# Patient Record
Sex: Female | Born: 1970 | Race: White | Hispanic: No | Marital: Married | Smoking: Current every day smoker
Health system: Southern US, Community
[De-identification: ages and names within clinical notes are randomized; demographics above are authoritative.]

## PROBLEM LIST (undated history)

## (undated) DIAGNOSIS — K5900 Constipation, unspecified: Secondary | ICD-10-CM

## (undated) DIAGNOSIS — I10 Essential (primary) hypertension: Secondary | ICD-10-CM

## (undated) DIAGNOSIS — Z86718 Personal history of other venous thrombosis and embolism: Secondary | ICD-10-CM

## (undated) DIAGNOSIS — Z973 Presence of spectacles and contact lenses: Secondary | ICD-10-CM

## (undated) DIAGNOSIS — D259 Leiomyoma of uterus, unspecified: Secondary | ICD-10-CM

## (undated) DIAGNOSIS — Z972 Presence of dental prosthetic device (complete) (partial): Secondary | ICD-10-CM

## (undated) HISTORY — DX: Leiomyoma of uterus, unspecified: D25.9

## (undated) HISTORY — PX: TONSILLECTOMY: SUR1361

## (undated) HISTORY — DX: Essential (primary) hypertension: I10

## (undated) HISTORY — PX: DILATION AND CURETTAGE OF UTERUS: SHX78

---

## 1996-02-26 DIAGNOSIS — Z86718 Personal history of other venous thrombosis and embolism: Secondary | ICD-10-CM

## 1996-02-26 HISTORY — DX: Personal history of other venous thrombosis and embolism: Z86.718

## 1997-06-07 ENCOUNTER — Other Ambulatory Visit: Admission: RE | Admit: 1997-06-07 | Discharge: 1997-06-07 | Payer: Self-pay | Admitting: Obstetrics and Gynecology

## 1999-02-02 ENCOUNTER — Ambulatory Visit (HOSPITAL_COMMUNITY): Admission: RE | Admit: 1999-02-02 | Discharge: 1999-02-02 | Payer: Self-pay | Admitting: Obstetrics & Gynecology

## 1999-02-02 ENCOUNTER — Encounter: Payer: Self-pay | Admitting: Obstetrics & Gynecology

## 1999-02-06 ENCOUNTER — Inpatient Hospital Stay (HOSPITAL_COMMUNITY): Admission: AD | Admit: 1999-02-06 | Discharge: 1999-02-08 | Payer: Self-pay | Admitting: Obstetrics & Gynecology

## 1999-02-06 ENCOUNTER — Encounter (INDEPENDENT_AMBULATORY_CARE_PROVIDER_SITE_OTHER): Payer: Self-pay | Admitting: Specialist

## 1999-02-10 ENCOUNTER — Inpatient Hospital Stay (HOSPITAL_COMMUNITY): Admission: AD | Admit: 1999-02-10 | Discharge: 1999-02-10 | Payer: Self-pay | Admitting: Obstetrics and Gynecology

## 1999-02-10 ENCOUNTER — Encounter: Payer: Self-pay | Admitting: Obstetrics and Gynecology

## 1999-02-10 ENCOUNTER — Encounter: Admission: RE | Admit: 1999-02-10 | Discharge: 1999-05-11 | Payer: Self-pay | Admitting: Obstetrics and Gynecology

## 1999-07-02 ENCOUNTER — Encounter (INDEPENDENT_AMBULATORY_CARE_PROVIDER_SITE_OTHER): Payer: Self-pay | Admitting: *Deleted

## 1999-07-02 ENCOUNTER — Ambulatory Visit (HOSPITAL_BASED_OUTPATIENT_CLINIC_OR_DEPARTMENT_OTHER): Admission: RE | Admit: 1999-07-02 | Discharge: 1999-07-03 | Payer: Self-pay | Admitting: Otolaryngology

## 2005-01-09 ENCOUNTER — Other Ambulatory Visit: Admission: RE | Admit: 2005-01-09 | Discharge: 2005-01-09 | Payer: Self-pay | Admitting: Obstetrics & Gynecology

## 2005-05-02 ENCOUNTER — Encounter (INDEPENDENT_AMBULATORY_CARE_PROVIDER_SITE_OTHER): Payer: Self-pay | Admitting: *Deleted

## 2005-05-02 ENCOUNTER — Ambulatory Visit (HOSPITAL_COMMUNITY): Admission: RE | Admit: 2005-05-02 | Discharge: 2005-05-02 | Payer: Self-pay | Admitting: Obstetrics & Gynecology

## 2010-02-02 ENCOUNTER — Encounter
Admission: RE | Admit: 2010-02-02 | Discharge: 2010-02-02 | Payer: Self-pay | Source: Home / Self Care | Attending: Obstetrics & Gynecology | Admitting: Obstetrics & Gynecology

## 2010-07-13 NOTE — Op Note (Signed)
[facility]  Patient:    [name]                    MRN: [id number] Proc. Date: 02/06/99 Adm. Date:  [id number] Attending:  Mickle Mallory                           Operative Report  AGE:                          40  PREOPERATIVE DIAGNOSIS:       Suspected macrosomia, post dates and unfavorable                               cervix, desires permanent elective sterilization.  POSTOPERATIVE DIAGNOSIS:      Suspected macrosomia, past dates and unfavorable                               cervix, desires permanent elective sterilization lus                               delivery of 9 pound and 10 ounce female infant with                               Apgars of 9 and 9, and segment of each fallopian ube                               - pathology pending on them.  OPERATION:                    Primary low transverse cesarean section and [place]                               tubal sterilization for elective sterilization.  SURGEON:                      Gerrit Friends. Aldona Bar, M.D.  ASSISTANT:                    Luvenia Redden, M.D.  ANESTHESIA:                   Spinal - Donald T. Pamalee Leyden, M.D.  INDICATIONS:                  This is a 40 year old primigravida at 13-[redacted] weeks  gestation who was seen in the office on December 11 by Marcelle Overlie, M.D. At that time, a nonstress test was reactive and an ultrasound for estimated fetal weight done three days earlier revealed an estimated fetal weight of at least nine pounds.  The patient also expresses a desire for permanent elective sterilization procedure, understanding that such a procedure is meant to be 100% permanent, but unfortunately, it is not 100% perfect - subsequent pregnancy can result.                                After a detailed discussion with  the patient as to options, the decision was made to proceed with primary low transverse cesarean section with primary diagnosis of  suspected macrosomia and carry out a tubal sterilization procedure at the time of the cesarean section.  She is taken to the operating room according to her wishes at this time for such a procedure.  DESCRIPTION OF PROCEDURE:     The patient was taken to the operating room after  having received 2 g of ampicillin intravenously in the preoperative area (positive group B Strep culture _______).  Once in the operating room, a spinal anesthetic was given by Dr. Pamalee Leyden without difficulty, and thereafter, the patient was prepped and draped in the usual fashion, having been placed in the supine position with  slight tilt to the left with a Foley catheter observed as part of the prep.                                Once good anesthesia levels were documented, the procedure was begun.  A Pfannenstiel incision was made with minimal difficulty,  dissecting down to and through the fascia in a low transverse fashion with hemostasis created at each layer.  The fascia was incised in a low transverse fashion.  A subfascial space was created inferiorly and superiorly, and muscle separated in the midline and the peritoneum identified ________ with care taken to avoid the bowel superiorly and the bladder inferiorly.  At this time, the vesicouterine peritoneum was incised in a low transverse fashion and pushed off the lower uterine segment with ease, and thereafter, using the Metzenbaum scissors, a transverse incision was made in the lower uterine segment and extended with the  fingers.  Amniotomy was created with production of clear fluid and at this time, delivery of a viable female infant from an OP position was carried out without difficulty.  The infant cried spontaneously at once.  There was a nuchal cord x 1.                                After the cord was clamped and cut, the infant was passed off to the awaiting team, and thereafter taken to the nursery in good condition.  Subsequent  weight found to be 9 pounds and 10 ounces, and Apgars were noted to be 9 and 9.                                After placenta was delivered intact (it was sent o pathology and appropriately labeled).  The uterus was delivered from the abdominal incision and was free of any remaining products of conception, and thereafter, closure of the uterus was begun in the usual fashion using #1 Vicryl in a running and locking fashion.  Hemostasis was adequate.  Tubes and ovaries were noted to be normal.  At this time, a sterilization procedure was carried out.                                A segment of the right fallopian tube was grasped in the midportion with an Allis clamp and a loop of tube was created.  A free tie f #1 plain catgut suture tied down about the loop, and the loop was excised and  sent to pathology.  A similar procedure was carried out on the left fallopian tube.  Assured of good hemostasis of each of the tubal sterilization sites and the uterine incision.  The uterus was then replaced into the abdomen and the abdomen lavaged of all free blood and clot.  All counts were noted to be correct at this point, and no foreign bodies were noted to be remaining in the abdominal cavity.                                Closure of the abdomen at this time was begun in layers.  The abdominal peritoneum was closed with 0 Vicryl in a running fashion and the muscle secured with same.  Assured of good subfascial hemostasis, the fascia was then reapproximated with 0 Vicryl in the midline bilaterally.  The subcutaneous tissue was then rendered hemostatic and reapproximated using 2-0 plain in an interrupted fashion.  Staples were then used to close the skin and a sterile pressure dressing was applied.                                The patient at this time was transported to the recovery room in satisfactory condition.  At the conclusion of the procedure, both patient and her baby were  both doing well in the respective recovery areas. All counts were correct x 2.  Pathologic specimen consisted of placenta and the segment of each fallopian tube.                                In summary, this patient who presented at 41-[redacted] weeks  gestation with a totally infavorable cervix and suspected macrosomia, and desire for sterilization was taken to the operating room for primary low transverse cesarean section and tubal sterilization procedure.  All of this went well. DD:  02/06/99 TD:  02/07/99 Job: [id number]

## 2010-07-13 NOTE — Op Note (Signed)
[name], [name]            ACCOUNT NO.:  [id number]   MEDICAL RECORD NO.:  [id number]          PATIENT TYPE:  AMB   LOCATION:  SDC                           FACILITY:  [facility]   PHYSICIAN:  Gerrit Friends. Aldona Bar, M.D.   DATE OF BIRTH:  1971-02-15   DATE OF PROCEDURE:  05/02/2005  DATE OF DISCHARGE:                                 OPERATIVE REPORT   PREOPERATIVE DIAGNOSIS:  Abnormal uterine bleeding, suspect polyp or  submucosal myoma.   POSTOP DIAGNOSES:  Abnormal uterine bleeding, suspect polyp or submucosal  myoma. Pathology pending.   PROCEDURE:  Examination under anesthesia and dilatation curettage.   SURGEON:  Dr. Aldona Bar   ANESTHESIA:  Intravenous conscious sedation and paracervical block with 1%  Xylocaine without epinephrine.   HISTORY:  This 40 year old female who until November 2006 had not been seen  since delivery of her first and only child by cesarean section at which time  she had a tubal as well in 2000 - presented in November 2006 with abnormal  uterine bleeding. When seen in the office an ultrasound was done with a  suggestion of a polyp within the cavity. Endometrial biopsy was done and was  benign and consistent with fragments of polyp. She was placed on  progesterone. Her hemoglobin at that time as well as the thyroid function  tests were normal. She returned after several months complaining of  continued abnormal bleeding. A sonohysterogram was done with again a  suggestion of about a 1 cm to 2 cm space-occupying lesion in the fundal area  of the uterus. It was possible that this could represent either a submucosal  myoma or a large endometrial polyp. She is now taken to the operating room  for further definition of this problem.   PROCEDURE:  The patient was taken to the operating room where after  satisfactory induction of intravenous conscious sedation, she was prepped  and draped and placed in modified lithotomy position in short Allen  stirrups. Bladder  was drained of clear urine with rubber catheter in-and-out  fashion. Examination under anesthesia carried out with findings consistent  with the uterus that was upper limits of normal size and mobile. At this  time a speculum was placed in the vagina and single-tooth tenaculum placed  on the anterior lip of the cervix. The uterus did not pull down well at all.  A paracervical block at this time was carried out using approximately 18 mL  of 1% Xylocaine without epinephrine. Thereafter the internal os was dilated  without difficulty to a 31 Pratt dilator. Probing the cavity with the  Randall stone forceps and small sponge forceps produced no polypoid like  tissue and curettage with the small serrated curette followed by the small  regular curette thoroughly, gently and systematically evacuated the cavity  but there was a lesion at the posterior right lateral portion of the fundus  that was consistent with a submucosal myoma. This area bulged into the  cavity and felt to be definitely broad-based in terms of its attachment to  the uterus consistent with a submucosal myoma. All specimen was sent  labeled  endometrial curettings. Again examination under anesthesia was carried out  with findings consistent with upper limits of normal size uterus - the  cavity sounded to 9 cm.   At this time procedure was felt to be complete and was terminated. The  patient was transported to recovery in satisfactory condition having  tolerated the procedure well. Estimated blood loss 50 mL. All counts correct  x2. Pathologic specimen; endometrial curettings.   The patient will be discharged home with appropriate instructions to follow-  up in the office. It is very likely that if the curettage does not result in  better control of her bleeding then she would be a candidate for total  abdominal hysterectomy.      Gerrit Friends. Aldona Bar, M.D.  Electronically Signed     RMW/MEDQ  D:  05/02/2005  T:  05/02/2005   Job:  [id number]

## 2010-07-13 NOTE — Op Note (Signed)
[place]  Patient:    NEIVA, [name] Visit Number: [id number] MRN: [id number]          Service Type: Attending:  Jeannett Senior. Pollyann Kennedy, M.D. Proc. Date: 07/02/99   CC:         Colon Flattery, D.O.                           Operative Report  PREOPERATIVE DIAGNOSIS:  Deviated septum, turbinate hypertrophy, tonsil hypertrophy, loud snoring, suspected obstructive sleep apnea.  POSTOPERATIVE DIAGNOSIS:  Deviated septum, turbinate hypertrophy, tonsil hypertrophy, loud snoring, suspected obstructive sleep apnea.  OPERATION PERFORMED: 1. Tonsillectomy. 2. Nasal septoplasty. 3. Submucous resection of right inferior turbinate. 4. Partial excision of left inferior turbinate.  SURGEON:  Jefry H. Pollyann Kennedy, M.D.  ANESTHESIA:  General endotracheal.  COMPLICATIONS:  None.  ESTIMATED BLOOD LOSS:  40 cc.  FINDINGS:  Diffuse enlargement of the tonsils, high deviation of the septum towards the left side as well as an inferior spur down the left side. Elongation and very thin left inferior turbinate with thickened and bony overgrowth of the right inferior turbinate.  REFERRING PHYSICIAN:  Colon Flattery, D.O.  The patient tolerated the procedure well, was awakened, extubated and transferred to recovery in stable condition.  INDICATIONS FOR PROCEDURE:  The patient is a 40 year old lady with a long history of loud snoring and suspected sleep apnea.  The risks, benefits, alternatives and complications of the procedure were explained to the patient, who seemed to understand and agreed to surgery.  DESCRIPTION OF PROCEDURE:  The patient was taken to the operating room and placed on the operating table in the supine position.  Following induction of general endotracheal anesthesia, the table was turned 90 degrees and the patient was draped in a standard fashion.  1 - Tonsillectomy.  A Crowe-Davis mouth gag was inserted into the oral cavity, used to retract the  tongue and mandible and attached to the Mayo stand.  A red rubber catheter was inserted into the right side of the nose withdrawn through the mouth and used to retract the soft palate and uvula.  The tonsils were dissected with electrocautery dissecting between the capsule of the tonsil and the constrictor muscle.  A clean dissection was accomplished although inferiorly on both sides there was a bleeder identified that was coagulated using suction cautery.  The remainder of the tonsillar fossa were treated for hemostasis using the standard Bovie.  The tonsils were sent together for pathologic evaluatio.  The mouth gag was released and an orogastric tube was used to aspirate the contents of the stomach at the end of the procedure.  2 - Septoplasty.  A left hemistransfixion incision was created and used to approach the septal cartilage.  Prior to that, oxymetazoline spray as well as injected Xylocaine with epinephrine and then oxymetazoline soaked pledgets in both nasal cavities.  A flap was developed posteriorly to the sphenoid rostrum.  The bony cartilaginous junction was divided and the flap was developed down the right side posteriorly.  The superior attachment of the ethmoid plate was taken down using fenestrated Jansen-Middleton rongeur.  This was removed as high as possible because of the high deviation.  The posterior and inferior attachments were quite thin and were easily resected using the Therapist, nutritional.  Large fragments of bone were removed.  The other trouble area was the inferior caudal septum where the cartilage was found to drape overhanging a spur along the  maxillary crest.  The cartilage was trimmed with a 15 scalpel and then detached from the left side of the maxillary crest.  The left side of the maxillary crest was taken down using a 4 mm osteotome.  The septal incision was reapproximated with interrupted 4-0 chromic and a quilting suture of 4-0 plain gut was used to  reappose the flaps.  3 - Submucous resection of right inferior turbinate.  A 15 scalpel was used to incise the leading edge of the turbinate.  A Freer elevator was used to elevate mucosa off the bony septum inferiorly, medially and laterally.  Large fragments of bone were removed.  This allowed the turbinate mucosa to be lateralized with a Therapist, nutritional and outfractured.  4 - Partial excision, left inferior turbinate.  The left inferior turbinate had very thin, fragile bone.  A turbinate scissors was used to resect the anterior and inferior aspect of the turbinate.  The remnant was outfractured with a Therapist, nutritional.  The nasal cavities were suctioned and packed with rolled up Telfa gauze coated with Bacitracin.  The patient was then awakened, extubated and transferred to recovery in stable condition. Attending:  Jeannett Senior. Pollyann Kennedy, M.D. DD:  07/02/99 TD:  07/03/99 Job: [id number]

## 2013-07-13 ENCOUNTER — Other Ambulatory Visit: Payer: Self-pay | Admitting: Obstetrics & Gynecology

## 2015-08-15 ENCOUNTER — Other Ambulatory Visit: Payer: Self-pay | Admitting: Obstetrics & Gynecology

## 2015-08-16 LAB — CYTOLOGY - PAP

## 2017-10-20 ENCOUNTER — Other Ambulatory Visit: Payer: Self-pay | Admitting: Obstetrics & Gynecology

## 2017-10-20 DIAGNOSIS — R1084 Generalized abdominal pain: Secondary | ICD-10-CM

## 2017-10-21 ENCOUNTER — Ambulatory Visit
Admission: RE | Admit: 2017-10-21 | Discharge: 2017-10-21 | Disposition: A | Discharge status: Home / Self Care | Payer: No Typology Code available for payment source | Source: Ambulatory Visit | Attending: Obstetrics & Gynecology | Admitting: Obstetrics & Gynecology

## 2017-10-21 DIAGNOSIS — R1084 Generalized abdominal pain: Secondary | ICD-10-CM

## 2017-10-21 MED ORDER — IOPAMIDOL (ISOVUE-300) INJECTION 61%
100.0000 mL | Freq: Once | INTRAVENOUS | Status: AC | PRN
  Administered 2017-10-21: 100 mL via INTRAVENOUS

## 2017-10-23 NOTE — Progress Notes (Signed)
[place] at [facility] Note: New Patient FIRST VISIT   Consult was requested by Dr. Vania Rea for a pelvic mass   Chief Complaint  Patient presents with  . Pelvic mass   ASSESSMENT Pelvic mass (ovarian mass versus fibroid) Ascites with questionable omental edema Tumor markers pending  PLAN  1. Data reviewed ? I independently reviewed the images and the radiology reports and discussed my interpretation in the presence of the patient and her mother today. ? I reviewed her referring doctor's office notes and I have summarized in the HPI ? History was obtained partly from the chart and from the patient ? We reviewed she has not yet had a tumor marker and I ordered those today 2. Pelvic mass ? We discuss range of possibilities, from a fibroid on the uterus, to fibroid of the ovary, versus other benign or malignant ovarian lesions ? CT imaging sees a normal uterus and suggests this mass arises from the left adnexa ? Again, CA125 and CEA will be drawn today 3. Management ? Some items on the imaging concern me, including the ascites and the questionable omental disease ? I think this warrants surgical assessment ? If this is a malignant adnexal mass  ? and there is upper abdominal disease then the plan would be debulking along with hyst/BSO ? and there is no upper abdominal disease then plan would be for hyst/BSO/staging ? If this is a benign adnexal mass (such as an ovarian fibroma) then plan would be for USO  ? If this is a uterine fibroid she favors hysterectomy, however given her age I would not remove the adnexa and we discussed the benefits of retaining her ovaries today. 4. Surgical discussion o We reviewed the hope surgery can be performed laparoscopically, but she understands this may convert to an open procedure. o She will meet with Dr. Denman George next week who is able to accommodate her onto the surgery schedule sooner than  me. o H/o DVT - will likely give periop Lovenox prophylaxis.     HPI: [name]  is a very nice 47 y.o.  P1  She sees Dr. Stann Mainland for Gynecologic care. She was told she had fibroids in March 2019 and compared to a prior ultrasound there was no change. In July 2019 she noted constipation and increasing abdominal girth. She scheduled followup with Dr. Stann Mainland and repeat ultrasound was done. In addition thickening of the endometrium prompted an endometrial biopsy. We do not have the ultrasound reports prior 09/2017. EMB showed scant benign endometrial fragments. The office note discussion is that the ultrasound showed marked changes compared to prior including free fluid and so a CT scan was ordered.   Imaging with CTAP was done 10/22/17 Mild hepatomegaly at 17.7 cm craniocaudal. Scattered colonic diverticula. Aortic and branch vessel atherosclerosis. This is age advanced. No abdominopelvic adenopathy. Reproductive: A heterogeneous partially cystic and partially soft tissue density anterior pelvic mass measures on the order of 13.7 x 11.5 by 13.8 cm. This is intimately associated with and most likely arises from the left ovary, including on 72/2. Normal appearance of the uterus. Other: Areas of omental edema, including within the upper abdomen on image 22/2. No well-defined measurable peritoneal or omental mass. Moderate volume abdominopelvic ascites. Given the clinical history, an exophytic uterine fibroid is possible but felt less likely.   Tumor markers are not yet performed.  The patient notes a prior history of heavy bleeding and  being placed on Provera with no menses now for 19 years.  She denies N/V. Has had a weight gain rather than weight loss. Does note constipation and intermittent diarrhea. For the past 3 weeks she has noted early satiety.   Given the complex nature of the mass and its suspected ovarian origin she was referred for recommendations and management.   Imported EPIC  Oncologic History:   No history exists.    ECOG PERFORMANCE STATUS: 1 - Symptomatic but completely ambulatory  Measurement of disease: TBD .   Radiology: Ct Abdomen Pelvis W Contrast  Result Date: 10/22/2017 CLINICAL DATA:  Abdominal pressure in swelling. Weight gain of 10 pounds in 1 month. Known fibroid. EXAM: CT ABDOMEN AND PELVIS WITH CONTRAST TECHNIQUE: Multidetector CT imaging of the abdomen and pelvis was performed using the standard protocol following bolus administration of intravenous contrast. CONTRAST:  12mL ISOVUE-300 IOPAMIDOL (ISOVUE-300) INJECTION 61% COMPARISON:  None. FINDINGS: Lower chest: Clear lung bases. Normal heart size without pericardial or pleural effusion. Hepatobiliary: Mild hepatomegaly at 17.7 cm craniocaudal. No cirrhosis. No focal liver lesion. Normal gallbladder, without biliary ductal dilatation. Pancreas: Normal, without mass or ductal dilatation. Spleen: Normal in size, without focal abnormality. Adrenals/Urinary Tract: Normal right adrenal gland. Mild left adrenal thickening. No hydronephrosis. Normal urinary bladder. Stomach/Bowel: Normal remainder of the stomach. Scattered colonic diverticula. Normal colon and terminal ileum. Normal small bowel. Vascular/Lymphatic: Aortic and branch vessel atherosclerosis. This is age advanced. No abdominopelvic adenopathy. Reproductive: A heterogeneous partially cystic and partially soft tissue density anterior pelvic mass measures on the order of 13.7 x 11.5 by 13.8 cm. This is intimately associated with and most likely arises from the left ovary, including on 72/2. Normal appearance of the uterus. Other: Small amount of abdominopelvic ascites. Interloop mesenteric fluid. Areas of omental edema, including within the upper abdomen on image 22/2. No well-defined measurable peritoneal or omental mass. Musculoskeletal: No acute osseous abnormality. Disc bulges at L4-5 and L5-S1. IMPRESSION: 1. Moderate volume abdominopelvic  ascites. Dominant anterior pelvic mass. This is favored to arise from the left ovary and represent ovarian neoplasm. Given the clinical history, an exophytic uterine fibroid is possible but felt less likely. Consider gynecological referral and if further imaging characterization is desired, pre and post-contrast pelvic MRI. 2. Omental edema, which may be secondary to ascites. No well-defined omental/peritoneal metastasis. 3.  Tiny hiatal hernia. 4. Aortic Atherosclerosis (ICD10-I70.0). This is significantly age advanced. 5. Mild hepatomegaly. These results will be called to the ordering clinician or representative by the Radiologist Assistant, and communication documented in the PACS or zVision Dashboard. Electronically Signed   By: Abigail Miyamoto M.D.   On: 10/22/2017 11:59  .  Marland Kitchen   Outpatient Encounter Medications as of 10/24/2017  Medication Sig  . ibuprofen (ADVIL,MOTRIN) 200 MG tablet Take 200 mg by mouth every 6 (six) hours as needed for moderate pain.  Marland Kitchen lisinopril-hydrochlorothiazide (PRINZIDE,ZESTORETIC) 20-12.5 MG tablet Take 1 tablet by mouth daily.  . medroxyPROGESTERone (PROVERA) 10 MG tablet Take 10 mg by mouth at bedtime. Take 2 tablets at bedtime  . DIETHYLPROPION HCL ER PO Take by mouth. 75 mg tablet - take one tablet every day by mouth in the morning.   No facility-administered encounter medications on file as of 10/24/2017.    Allergies  Allergen Reactions  . Codeine     Past Medical History:  Diagnosis Date  . Blood clot associated with vein wall inflammation 1998   Blood clot in left leg  . Bronchitis   .  Hypertension   . Obesity   . Phlebitis   . Uterine leiomyoma    Past Surgical History:  Procedure Laterality Date  . CESAREAN SECTION  2000  . DILATION AND CURETTAGE OF UTERUS  2007        Past Gynecological History:   GYNECOLOGIC HISTORY:  . No LMP recorded. 19 years ago . Menarche: 47 years old . P 1 . Contraceptive OCP > 10years . HRT none  . Last Pap  07/2015 negative on record Family Hx:  Family History  Problem Relation Age of Onset  . Diabetes Mother   . Hypertension Mother   . Prostate cancer Father    Social Hx:  Marland Kitchen Tobacco use: Current every day . Alcohol use: holidays only . Illicit Drug use: none . Illicit IV Drug use: none    Review of Systems: Review of Systems  Gastrointestinal: Positive for abdominal distention, constipation and diarrhea.  Psychiatric/Behavioral: The patient is nervous/anxious.   All other systems reviewed and are negative. + early satiety  Vitals: There were no vitals taken for this visit. Vitals:   10/24/17 1414  Weight: 241 lb 11.2 oz (109.6 kg)  Height: 5\' 7"  (1.702 m)    Vitals:   10/24/17 1414  BP: 124/80  Pulse: 88  Resp: 18  Temp: 98.3 F (36.8 C)  SpO2: 100%   Body mass index is 37.86 kg/m.   Physical Exam: General :  Obese, Well developed, 47 y.o., female in no apparent distress HEENT:  Normocephalic/atraumatic, symmetric, EOMI, eyelids normal Neck:   Supple, no masses.  Lymphatics:  No cervical/ submandibular/ supraclavicular/ infraclavicular/ inguinal adenopathy Respiratory:  Respirations unlabored, no use of accessory muscles CV:   Deferred Breast:  Deferred Musculoskeletal: No CVA tenderness, normal muscle strength. Abdomen:  Soft, non-tender + distension No evidence of hernia. No masses. Extremities:  No lymphedema, no erythema, non-tender. Skin:   Normal inspection Neuro/Psych:  No focal motor deficit, no abnormal mental status. Normal gait. Normal affect. Alert and oriented to person, place, and time  Genito Urinary: Vulva: Normal external female genitalia.  Bladder/urethra: Urethral meatus normal in size and location. No lesions or   masses, well supported bladder Speculum exam: Vagina: No lesion, no discharge, no bleeding. Cervix: Normal appearing, no lesions. Bimanual exam: limited due to habitus Uterus: Mobile. Size difficult to determine  Adnexa: No  masses. Rectovaginal:  Good tone, no masses, no cul de sac nodularity, no parametrial involvement or nodularity.   Face to face time with patient was 45 minutes. Over 50% of this time was spent on counseling and coordination of care.  Isabel Caprice, MD  10/24/2017, 3:35 PM    Cc: Vania Rea, MD (Referring Ob/Gyn) Patient, No Pcp Per (PCP)

## 2017-10-24 ENCOUNTER — Inpatient Hospital Stay: Payer: Self-pay | Attending: Obstetrics | Admitting: Obstetrics

## 2017-10-24 ENCOUNTER — Encounter: Payer: Self-pay | Admitting: *Deleted

## 2017-10-24 ENCOUNTER — Inpatient Hospital Stay: Payer: Self-pay

## 2017-10-24 VITALS — BP 124/80 | HR 88 | Temp 98.3°F | Resp 18 | Ht 67.0 in | Wt 241.7 lb

## 2017-10-24 DIAGNOSIS — R970 Elevated carcinoembryonic antigen [CEA]: Secondary | ICD-10-CM | POA: Insufficient documentation

## 2017-10-24 DIAGNOSIS — R971 Elevated cancer antigen 125 [CA 125]: Secondary | ICD-10-CM | POA: Insufficient documentation

## 2017-10-24 DIAGNOSIS — R1900 Intra-abdominal and pelvic swelling, mass and lump, unspecified site: Secondary | ICD-10-CM | POA: Diagnosis present

## 2017-10-24 DIAGNOSIS — D398 Neoplasm of uncertain behavior of other specified female genital organs: Secondary | ICD-10-CM | POA: Insufficient documentation

## 2017-10-24 DIAGNOSIS — R188 Other ascites: Secondary | ICD-10-CM | POA: Insufficient documentation

## 2017-10-24 NOTE — Patient Instructions (Signed)
Plan to have a CA 125 and CEA level drawn today.  Plan to meet with Dr. Denman George in the office on Tuesday, Sept 3 at 2pm to discuss surgery in more detail.                Preparing for your Surgery  Plan for surgery on October 30, 2017 with Dr. Everitt Amber at [place] will be scheduled for a robotic assisted unilateral salpingo-oophorectomy, mini-laparotomy, possible total laparoscopic hysterectomy, possible debulking, possible staging.  Pre-operative Testing -You will receive a phone call from presurgical testing at [facility] to arrange for a pre-operative testing appointment before your surgery.  This appointment normally occurs one to two weeks before your scheduled surgery.   -Bring your insurance card, copy of an advanced directive if applicable, medication list  -At that visit, you will be asked to sign a consent for a possible blood transfusion in case a transfusion becomes necessary during surgery.  The need for a blood transfusion is rare but having consent is a necessary part of your care.     -You should not be taking blood thinners or aspirin at least ten days prior to surgery unless instructed by your surgeon.  Day Before Surgery at [place] will be asked to take in a light diet the day before surgery.  Avoid carbonated beverages.  You will be advised to have nothing to eat or drink after midnight the evening before.    Eat a light diet the day before surgery.  Examples including soups, broths, toast, yogurt, mashed potatoes.  Things to avoid include carbonated beverages  (fizzy beverages), raw fruits and raw vegetables, or beans.   If your bowels are filled with gas, your surgeon will have difficulty visualizing your pelvic organs which increases your surgical risks.  Your role in recovery Your role is to become active as soon as directed by your doctor, while still giving yourself time to heal.  Rest when you feel tired. You will be asked to do the  following in order to speed your recovery:  - Cough and breathe deeply. This helps toclear and expand your lungs and can prevent pneumonia. You may be given a spirometer to practice deep breathing. A staff member will show you how to use the spirometer. - Do mild physical activity. Walking or moving your legs help your circulation and body functions return to normal. A staff member will help you when you try to walk and will provide you with simple exercises. Do not try to get up or walk alone the first time. - Actively manage your pain. Managing your pain lets you move in comfort. We will ask you to rate your pain on a scale of zero to 10. It is your responsibility to tell your doctor or nurse where and how much you hurt so your pain can be treated.  Special Considerations -If you are diabetic, you may be placed on insulin after surgery to have closer control over your blood sugars to promote healing and recovery.  This does not mean that you will be discharged on insulin.  If applicable, your oral antidiabetics will be resumed when you are tolerating a solid diet.  -Your final pathology results from surgery should be available by the Friday after surgery and the results will be relayed to you when available.  -Dr. Lahoma Crocker is the Surgeon that assists your GYN Oncologist with surgery.  The next day after your surgery you will either  see your GYN Oncologist or Dr. Lahoma Crocker.   Blood Transfusion Information WHAT IS A BLOOD TRANSFUSION? A transfusion is the replacement of blood or some of its parts. Blood is made up of multiple cells which provide different functions.  Red blood cells carry oxygen and are used for blood loss replacement.  White blood cells fight against infection.  Platelets control bleeding.  Plasma helps clot blood.  Other blood products are available for specialized needs, such as hemophilia or other clotting disorders. BEFORE THE TRANSFUSION  Who  gives blood for transfusions?   You may be able to donate blood to be used at a later date on yourself (autologous donation).  Relatives can be asked to donate blood. This is generally not any safer than if you have received blood from a stranger. The same precautions are taken to ensure safety when a relative's blood is donated.  Healthy volunteers who are fully evaluated to make sure their blood is safe. This is blood bank blood. Transfusion therapy is the safest it has ever been in the practice of medicine. Before blood is taken from a donor, a complete history is taken to make sure that person has no history of diseases nor engages in risky social behavior (examples are intravenous drug use or sexual activity with multiple partners). The donor's travel history is screened to minimize risk of transmitting infections, such as malaria. The donated blood is tested for signs of infectious diseases, such as HIV and hepatitis. The blood is then tested to be sure it is compatible with you in order to minimize the chance of a transfusion reaction. If you or a relative donates blood, this is often done in anticipation of surgery and is not appropriate for emergency situations. It takes many days to process the donated blood. RISKS AND COMPLICATIONS Although transfusion therapy is very safe and saves many lives, the main dangers of transfusion include:   Getting an infectious disease.  Developing a transfusion reaction. This is an allergic reaction to something in the blood you were given. Every precaution is taken to prevent this. The decision to have a blood transfusion has been considered carefully by your caregiver before blood is given. Blood is not given unless the benefits outweigh the risks.

## 2017-10-25 LAB — CA 125: Cancer Antigen (CA) 125: 1267 U/mL — ABNORMAL HIGH (ref 0.0–38.1)

## 2017-10-28 ENCOUNTER — Other Ambulatory Visit (HOSPITAL_COMMUNITY): Payer: Self-pay

## 2017-10-28 ENCOUNTER — Encounter: Payer: Self-pay | Admitting: Gynecologic Oncology

## 2017-10-28 ENCOUNTER — Inpatient Hospital Stay: Payer: Self-pay | Attending: Gynecologic Oncology | Admitting: Gynecologic Oncology

## 2017-10-28 VITALS — BP 126/89 | HR 80 | Temp 99.5°F | Resp 20 | Ht 67.0 in | Wt 243.9 lb

## 2017-10-28 DIAGNOSIS — D398 Neoplasm of uncertain behavior of other specified female genital organs: Secondary | ICD-10-CM | POA: Insufficient documentation

## 2017-10-28 DIAGNOSIS — D271 Benign neoplasm of left ovary: Secondary | ICD-10-CM | POA: Insufficient documentation

## 2017-10-28 DIAGNOSIS — Z90722 Acquired absence of ovaries, bilateral: Secondary | ICD-10-CM | POA: Insufficient documentation

## 2017-10-28 DIAGNOSIS — R971 Elevated cancer antigen 125 [CA 125]: Secondary | ICD-10-CM | POA: Insufficient documentation

## 2017-10-28 DIAGNOSIS — R1900 Intra-abdominal and pelvic swelling, mass and lump, unspecified site: Secondary | ICD-10-CM

## 2017-10-28 DIAGNOSIS — Z90710 Acquired absence of both cervix and uterus: Secondary | ICD-10-CM | POA: Insufficient documentation

## 2017-10-28 LAB — CEA (IN HOUSE-CHCC): CEA (CHCC-IN HOUSE): 8.47 ng/mL — AB (ref 0.00–5.00)

## 2017-10-28 NOTE — Progress Notes (Signed)
[place] at [facility]   Follow up Note: New Patient   Consult was initially requested by Dr. Vania Rea for a pelvic mass   Chief Complaint  Patient presents with  . Pelvic mass   ASSESSMENT Pelvic mass (ovarian mass versus fibroid) Ascites with questionable omental edema. Very elevated CA125 (>1000).  PLAN  I discussed with Corley that I have concerns that she may have an ovarian cancer. We plan on performing a robotic assisted hysterectomy with BSO. If frozen section reveals malignancy, staging with omentectomy and lymphadenectomy will be performed. Conversion to laparotomy may be necessary due to the large size of the mass.  If gross extraovarian malignancy is identified, we will perform laparotomy with debulking in addition to TAH, BSO.  I explained to the patient that if the mass is benign, it is an option to leave 1 ovary because BSO prior to age 6 is associated with decreased life expectancy. The patient understands this but is currently preferring BSO at the time of surgery  I explained that if malignancy is found, she will require postop chemotherapy.  I discussed risks of surgery including  bleeding, infection, damage to internal organs (such as bladder,ureters, bowels), blood clot, reoperation and rehospitalization. I discussed that bowel surgery may be necessary and therefore we will perform mechanical prep with mag citrate as she has a history of persistent constipation.    HPI: [name]  is a very nice 47 y.o.  P1  She sees Dr. Stann Mainland for Gynecologic care. She was told she had fibroids in March 2019 and compared to a prior ultrasound there was no change. In July 2019 she noted constipation and increasing abdominal girth. She scheduled followup with Dr. Stann Mainland and repeat ultrasound was done. In addition thickening of the endometrium prompted an endometrial biopsy. We do not have the ultrasound reports prior  09/2017. EMB showed scant benign endometrial fragments. The office note discussion is that the ultrasound showed marked changes compared to prior including free fluid and so a CT scan was ordered.   Imaging with CTAP was done 10/22/17 Mild hepatomegaly at 17.7 cm craniocaudal. Scattered colonic diverticula. Aortic and branch vessel atherosclerosis. This is age advanced. No abdominopelvic adenopathy. Reproductive: A heterogeneous partially cystic and partially soft tissue density anterior pelvic mass measures on the order of 13.7 x 11.5 by 13.8 cm. This is intimately associated with and most likely arises from the left ovary, including on 72/2. Normal appearance of the uterus. Other: Areas of omental edema, including within the upper abdomen on image 22/2. No well-defined measurable peritoneal or omental mass. Moderate volume abdominopelvic ascites. Given the clinical history, an exophytic uterine fibroid is possible but felt less likely.   Tumor markers are not yet performed.  The patient notes a prior history of heavy bleeding and being placed on Provera with no menses now for 19 years.  She denies N/V. Has had a weight gain rather than weight loss. Does note constipation and intermittent diarrhea. For the past 3 weeks she has noted early satiety.   Given the complex nature of the mass and its suspected ovarian origin she was referred for recommendations and management.   Imported EPIC Oncologic History:   No history exists.    ECOG PERFORMANCE STATUS: 1 - Symptomatic but completely ambulatory  Measurement of disease: TBD .   Radiology: Ct Abdomen Pelvis W Contrast  Result Date: 10/22/2017 CLINICAL DATA:  Abdominal pressure in swelling. Weight gain of 10  pounds in 1 month. Known fibroid. EXAM: CT ABDOMEN AND PELVIS WITH CONTRAST TECHNIQUE: Multidetector CT imaging of the abdomen and pelvis was performed using the standard protocol following bolus administration of intravenous contrast.  CONTRAST:  169mL ISOVUE-300 IOPAMIDOL (ISOVUE-300) INJECTION 61% COMPARISON:  None. FINDINGS: Lower chest: Clear lung bases. Normal heart size without pericardial or pleural effusion. Hepatobiliary: Mild hepatomegaly at 17.7 cm craniocaudal. No cirrhosis. No focal liver lesion. Normal gallbladder, without biliary ductal dilatation. Pancreas: Normal, without mass or ductal dilatation. Spleen: Normal in size, without focal abnormality. Adrenals/Urinary Tract: Normal right adrenal gland. Mild left adrenal thickening. No hydronephrosis. Normal urinary bladder. Stomach/Bowel: Normal remainder of the stomach. Scattered colonic diverticula. Normal colon and terminal ileum. Normal small bowel. Vascular/Lymphatic: Aortic and branch vessel atherosclerosis. This is age advanced. No abdominopelvic adenopathy. Reproductive: A heterogeneous partially cystic and partially soft tissue density anterior pelvic mass measures on the order of 13.7 x 11.5 by 13.8 cm. This is intimately associated with and most likely arises from the left ovary, including on 72/2. Normal appearance of the uterus. Other: Small amount of abdominopelvic ascites. Interloop mesenteric fluid. Areas of omental edema, including within the upper abdomen on image 22/2. No well-defined measurable peritoneal or omental mass. Musculoskeletal: No acute osseous abnormality. Disc bulges at L4-5 and L5-S1. IMPRESSION: 1. Moderate volume abdominopelvic ascites. Dominant anterior pelvic mass. This is favored to arise from the left ovary and represent ovarian neoplasm. Given the clinical history, an exophytic uterine fibroid is possible but felt less likely. Consider gynecological referral and if further imaging characterization is desired, pre and post-contrast pelvic MRI. 2. Omental edema, which may be secondary to ascites. No well-defined omental/peritoneal metastasis. 3.  Tiny hiatal hernia. 4. Aortic Atherosclerosis (ICD10-I70.0). This is significantly age advanced. 5.  Mild hepatomegaly. These results will be called to the ordering clinician or representative by the Radiologist Assistant, and communication documented in the PACS or zVision Dashboard. Electronically Signed   By: Abigail Miyamoto M.D.   On: 10/22/2017 11:59   .   Outpatient Encounter Medications as of 10/28/2017  Medication Sig  . ibuprofen (ADVIL,MOTRIN) 200 MG tablet Take 600 mg by mouth every 6 (six) hours as needed for headache or moderate pain.   Marland Kitchen lisinopril-hydrochlorothiazide (PRINZIDE,ZESTORETIC) 20-12.5 MG tablet Take 1 tablet by mouth daily.  . medroxyPROGESTERone (PROVERA) 10 MG tablet Take 20 mg by mouth at bedtime.   . [DISCONTINUED] DIETHYLPROPION HCL ER PO Take by mouth. 75 mg tablet - take one tablet every day by mouth in the morning.   No facility-administered encounter medications on file as of 10/28/2017.    Allergies  Allergen Reactions  . Codeine Other (See Comments)    Unknown reaction, mother was allergic    Past Medical History:  Diagnosis Date  . Blood clot associated with vein wall inflammation 1998   DVT in left leg; was on OCP and smoker. Had a ? injury on leg before it happened  . Bronchitis   . Hypertension   . Obesity   . Phlebitis   . Uterine leiomyoma    Past Surgical History:  Procedure Laterality Date  . CESAREAN SECTION  2000  . DILATION AND CURETTAGE OF UTERUS  2007        Past Gynecological History:   GYNECOLOGIC HISTORY:  . No LMP recorded. 19 years ago . Menarche: 47 years old . P 1 . Contraceptive OCP > 10years . HRT none  . Last Pap 07/2015 negative on record Family Hx:  Family History  Problem Relation Age of Onset  . Diabetes Mother   . Hypertension Mother   . Prostate cancer Father 45   Social Hx:  Marland Kitchen Tobacco use: Current every day . Alcohol use: holidays only . Illicit Drug use: none . Illicit IV Drug use: none    Review of Systems: Review of Systems  Gastrointestinal: Positive for abdominal distention, constipation and  diarrhea.  Psychiatric/Behavioral: The patient is nervous/anxious.   All other systems reviewed and are negative. + early satiety  Vitals: There were no vitals taken for this visit. Vitals:   10/28/17 1408  Weight: 243 lb 14.4 oz (110.6 kg)  Height: 5\' 7"  (1.702 m)    Vitals:   10/28/17 1408  BP: 126/89  Pulse: 80  Resp: 20  Temp: 99.5 F (37.5 C)  SpO2: 100%   Body mass index is 38.2 kg/m.   Physical Exam: General :  Obese, Well developed, 47 y.o., female in no apparent distress HEENT:  Normocephalic/atraumatic, symmetric, EOMI, eyelids normal Neck:   Supple, no masses.  Lymphatics:  No cervical/ submandibular/ supraclavicular/ infraclavicular/ inguinal adenopathy Respiratory:  Respirations unlabored, no use of accessory muscles CV:   Deferred Breast:  Deferred Musculoskeletal: No CVA tenderness, normal muscle strength. Abdomen:  Soft, non-tender + distension No evidence of hernia. No masses. Extremities:  No lymphedema, no erythema, non-tender. Skin:   Normal inspection Neuro/Psych:  No focal motor deficit, no abnormal mental status. Normal gait. Normal affect. Alert and oriented to person, place, and time  Genito Urinary: Vulva: Normal external female genitalia.  Bladder/urethra: Urethral meatus normal in size and location. No lesions or   masses, well supported bladder Speculum exam: Vagina: No lesion, no discharge, no bleeding. Cervix: Normal appearing, no lesions. Bimanual exam: limited due to habitus Uterus: Mobile. Size difficult to determine  Adnexa: No masses. Rectovaginal:  Good tone, no masses, no cul de sac nodularity, no parametrial involvement or nodularity.   [name], MD  10/28/2017, 3:48 PM

## 2017-10-28 NOTE — Patient Instructions (Signed)
Preparing for your Surgery  Plan for surgery on October 30, 2017 with Dr. Everitt Amber at [facility] will be scheduled for a robotic assisted total hysterectomy, bilateral salpingo-oophorectomy, possible exploratory laparotomy, possible staging/debulking.  Pre-operative Testing -You will receive a phone call from presurgical testing at [facility] to arrange for a pre-operative testing appointment before your surgery.  This appointment normally occurs one to two weeks before your scheduled surgery.   -Bring your insurance card, copy of an advanced directive if applicable, medication list  -At that visit, you will be asked to sign a consent for a possible blood transfusion in case a transfusion becomes necessary during surgery.  The need for a blood transfusion is rare but having consent is a necessary part of your care.     -You should not be taking blood thinners or aspirin at least ten days prior to surgery unless instructed by your surgeon.  Day Before Surgery at [facility] will be asked to take in a light diet the day before surgery.  Avoid carbonated beverages.  You will be advised to have nothing to eat or drink after midnight the evening before.    Eat a light diet the day before surgery.  Examples including soups, broths, toast, yogurt, mashed potatoes.  Things to avoid include carbonated beverages  (fizzy beverages), raw fruits and raw vegetables, or beans.   If your bowels are filled with gas, your surgeon will have difficulty visualizing your pelvic organs which increases your surgical risks.  Starting at 4pm the day before surgery, begin drinking two bottles of magnesium citrate and only take in clear liquids after that time.  Your role in recovery Your role is to become active as soon as directed by your doctor, while still giving yourself time to heal.  Rest when you feel tired. You will be asked to do the following in order to speed your  recovery:  - Cough and breathe deeply. This helps toclear and expand your lungs and can prevent pneumonia. You may be given a spirometer to practice deep breathing. A staff member will show you how to use the spirometer. - Do mild physical activity. Walking or moving your legs help your circulation and body functions return to normal. A staff member will help you when you try to walk and will provide you with simple exercises. Do not try to get up or walk alone the first time. - Actively manage your pain. Managing your pain lets you move in comfort. We will ask you to rate your pain on a scale of zero to 10. It is your responsibility to tell your doctor or nurse where and how much you hurt so your pain can be treated.  Special Considerations -If you are diabetic, you may be placed on insulin after surgery to have closer control over your blood sugars to promote healing and recovery.  This does not mean that you will be discharged on insulin.  If applicable, your oral antidiabetics will be resumed when you are tolerating a solid diet.  -Your final pathology results from surgery should be available by the Friday after surgery and the results will be relayed to you when available.  -Dr. Lahoma Crocker is the Surgeon that assists your GYN Oncologist with surgery.  The next day after your surgery you will either see your GYN Oncologist or Dr. Lahoma Crocker.   Blood Transfusion Information WHAT IS A BLOOD TRANSFUSION? A transfusion is the replacement of blood or  some of its parts. Blood is made up of multiple cells which provide different functions.  Red blood cells carry oxygen and are used for blood loss replacement.  White blood cells fight against infection.  Platelets control bleeding.  Plasma helps clot blood.  Other blood products are available for specialized needs, such as hemophilia or other clotting disorders. BEFORE THE TRANSFUSION  Who gives blood for transfusions?   You  may be able to donate blood to be used at a later date on yourself (autologous donation).  Relatives can be asked to donate blood. This is generally not any safer than if you have received blood from a stranger. The same precautions are taken to ensure safety when a relative's blood is donated.  Healthy volunteers who are fully evaluated to make sure their blood is safe. This is blood bank blood. Transfusion therapy is the safest it has ever been in the practice of medicine. Before blood is taken from a donor, a complete history is taken to make sure that person has no history of diseases nor engages in risky social behavior (examples are intravenous drug use or sexual activity with multiple partners). The donor's travel history is screened to minimize risk of transmitting infections, such as malaria. The donated blood is tested for signs of infectious diseases, such as HIV and hepatitis. The blood is then tested to be sure it is compatible with you in order to minimize the chance of a transfusion reaction. If you or a relative donates blood, this is often done in anticipation of surgery and is not appropriate for emergency situations. It takes many days to process the donated blood. RISKS AND COMPLICATIONS Although transfusion therapy is very safe and saves many lives, the main dangers of transfusion include:   Getting an infectious disease.  Developing a transfusion reaction. This is an allergic reaction to something in the blood you were given. Every precaution is taken to prevent this. The decision to have a blood transfusion has been considered carefully by your caregiver before blood is given. Blood is not given unless the benefits outweigh the risks.

## 2017-10-28 NOTE — H&P (View-Only) (Signed)
[place] at [facility]   Follow up Note: New Patient   Consult was initially requested by Dr. Vania Rea for a pelvic mass   Chief Complaint  Patient presents with  . Pelvic mass   ASSESSMENT Pelvic mass (ovarian mass versus fibroid) Ascites with questionable omental edema. Very elevated CA125 (>1000).  PLAN  I discussed with [name] that I have concerns that she may have an ovarian cancer. We plan on performing a robotic assisted hysterectomy with BSO. If frozen section reveals malignancy, staging with omentectomy and lymphadenectomy will be performed. Conversion to laparotomy may be necessary due to the large size of the mass.  If gross extraovarian malignancy is identified, we will perform laparotomy with debulking in addition to TAH, BSO.  I explained to the patient that if the mass is benign, it is an option to leave 1 ovary because BSO prior to age 89 is associated with decreased life expectancy. The patient understands this but is currently preferring BSO at the time of surgery  I explained that if malignancy is found, she will require postop chemotherapy.  I discussed risks of surgery including  bleeding, infection, damage to internal organs (such as bladder,ureters, bowels), blood clot, reoperation and rehospitalization. I discussed that bowel surgery may be necessary and therefore we will perform mechanical prep with mag citrate as she has a history of persistent constipation.    HPI: [name]  is a very nice 47 y.o.  P1  She sees Dr. Stann Mainland for Gynecologic care. She was told she had fibroids in March 2019 and compared to a prior ultrasound there was no change. In July 2019 she noted constipation and increasing abdominal girth. She scheduled followup with Dr. Stann Mainland and repeat ultrasound was done. In addition thickening of the endometrium prompted an endometrial biopsy. We do not have the ultrasound reports prior  09/2017. EMB showed scant benign endometrial fragments. The office note discussion is that the ultrasound showed marked changes compared to prior including free fluid and so a CT scan was ordered.   Imaging with CTAP was done 10/22/17 Mild hepatomegaly at 17.7 cm craniocaudal. Scattered colonic diverticula. Aortic and branch vessel atherosclerosis. This is age advanced. No abdominopelvic adenopathy. Reproductive: A heterogeneous partially cystic and partially soft tissue density anterior pelvic mass measures on the order of 13.7 x 11.5 by 13.8 cm. This is intimately associated with and most likely arises from the left ovary, including on 72/2. Normal appearance of the uterus. Other: Areas of omental edema, including within the upper abdomen on image 22/2. No well-defined measurable peritoneal or omental mass. Moderate volume abdominopelvic ascites. Given the clinical history, an exophytic uterine fibroid is possible but felt less likely.   Tumor markers are not yet performed.  The patient notes a prior history of heavy bleeding and being placed on Provera with no menses now for 19 years.  She denies N/V. Has had a weight gain rather than weight loss. Does note constipation and intermittent diarrhea. For the past 3 weeks she has noted early satiety.   Given the complex nature of the mass and its suspected ovarian origin she was referred for recommendations and management.   Im[place] EPIC Oncologic History:   No history exists.    ECOG PERFORMANCE STATUS: 1 - Symptomatic but completely ambulatory  Measurement of disease: TBD .   Radiology: Ct Abdomen Pelvis W Contrast  Result Date: 10/22/2017 CLINICAL DATA:  Abdominal pressure in swelling. Weight gain of 10  pounds in 1 month. Known fibroid. EXAM: CT ABDOMEN AND PELVIS WITH CONTRAST TECHNIQUE: Multidetector CT imaging of the abdomen and pelvis was performed using the standard protocol following bolus administration of intravenous contrast.  CONTRAST:  110mL ISOVUE-300 IOPAMIDOL (ISOVUE-300) INJECTION 61% COMPARISON:  None. FINDINGS: Lower chest: Clear lung bases. Normal heart size without pericardial or pleural effusion. Hepatobiliary: Mild hepatomegaly at 17.7 cm craniocaudal. No cirrhosis. No focal liver lesion. Normal gallbladder, without biliary ductal dilatation. Pancreas: Normal, without mass or ductal dilatation. Spleen: Normal in size, without focal abnormality. Adrenals/Urinary Tract: Normal right adrenal gland. Mild left adrenal thickening. No hydronephrosis. Normal urinary bladder. Stomach/Bowel: Normal remainder of the stomach. Scattered colonic diverticula. Normal colon and terminal ileum. Normal small bowel. Vascular/Lymphatic: Aortic and branch vessel atherosclerosis. This is age advanced. No abdominopelvic adenopathy. Reproductive: A heterogeneous partially cystic and partially soft tissue density anterior pelvic mass measures on the order of 13.7 x 11.5 by 13.8 cm. This is intimately associated with and most likely arises from the left ovary, including on 72/2. Normal appearance of the uterus. Other: Small amount of abdominopelvic ascites. Interloop mesenteric fluid. Areas of omental edema, including within the upper abdomen on image 22/2. No well-defined measurable peritoneal or omental mass. Musculoskeletal: No acute osseous abnormality. Disc bulges at L4-5 and L5-S1. IMPRESSION: 1. Moderate volume abdominopelvic ascites. Dominant anterior pelvic mass. This is favored to arise from the left ovary and represent ovarian neoplasm. Given the clinical history, an exophytic uterine fibroid is possible but felt less likely. Consider gynecological referral and if further imaging characterization is desired, pre and post-contrast pelvic MRI. 2. Omental edema, which may be secondary to ascites. No well-defined omental/peritoneal metastasis. 3.  Tiny hiatal hernia. 4. Aortic Atherosclerosis (ICD10-I70.0). This is significantly age advanced. 5.  Mild hepatomegaly. These results will be called to the ordering clinician or representative by the Radiologist Assistant, and communication documented in the PACS or zVision Dashboard. Electronically Signed   By: Abigail Miyamoto M.D.   On: 10/22/2017 11:59   .   Outpatient Encounter Medications as of 10/28/2017  Medication Sig  . ibuprofen (ADVIL,MOTRIN) 200 MG tablet Take 600 mg by mouth every 6 (six) hours as needed for headache or moderate pain.   Marland Kitchen lisinopril-hydrochlorothiazide (PRINZIDE,ZESTORETIC) 20-12.5 MG tablet Take 1 tablet by mouth daily.  . medroxyPROGESTERone (PROVERA) 10 MG tablet Take 20 mg by mouth at bedtime.   . [DISCONTINUED] DIETHYLPROPION HCL ER PO Take by mouth. 75 mg tablet - take one tablet every day by mouth in the morning.   No facility-administered encounter medications on file as of 10/28/2017.    Allergies  Allergen Reactions  . Codeine Other (See Comments)    Unknown reaction, mother was allergic    Past Medical History:  Diagnosis Date  . Blood clot associated with vein wall inflammation 1998   DVT in left leg; was on OCP and smoker. Had a ? injury on leg before it happened  . Bronchitis   . Hypertension   . Obesity   . Phlebitis   . Uterine leiomyoma    Past Surgical History:  Procedure Laterality Date  . CESAREAN SECTION  2000  . DILATION AND CURETTAGE OF UTERUS  2007        Past Gynecological History:   GYNECOLOGIC HISTORY:  . No LMP recorded. 19 years ago . Menarche: 47 years old . P 1 . Contraceptive OCP > 10years . HRT none  . Last Pap 07/2015 negative on record Family Hx:  Family History  Problem Relation Age of Onset  . Diabetes Mother   . Hypertension Mother   . Prostate cancer Father 38   Social Hx:  Marland Kitchen Tobacco use: Current every day . Alcohol use: holidays only . Illicit Drug use: none . Illicit IV Drug use: none    Review of Systems: Review of Systems  Gastrointestinal: Positive for abdominal distention, constipation and  diarrhea.  Psychiatric/Behavioral: The patient is nervous/anxious.   All other systems reviewed and are negative. + early satiety  Vitals: There were no vitals taken for this visit. Vitals:   10/28/17 1408  Weight: 243 lb 14.4 oz (110.6 kg)  Height: 5\' 7"  (1.702 m)    Vitals:   10/28/17 1408  BP: 126/89  Pulse: 80  Resp: 20  Temp: 99.5 F (37.5 C)  SpO2: 100%   Body mass index is 38.2 kg/m.   Physical Exam: General :  Obese, Well developed, 47 y.o., female in no apparent distress HEENT:  Normocephalic/atraumatic, symmetric, EOMI, eyelids normal Neck:   Supple, no masses.  Lymphatics:  No cervical/ submandibular/ supraclavicular/ infraclavicular/ inguinal adenopathy Respiratory:  Respirations unlabored, no use of accessory muscles CV:   Deferred Breast:  Deferred Musculoskeletal: No CVA tenderness, normal muscle strength. Abdomen:  Soft, non-tender + distension No evidence of hernia. No masses. Extremities:  No lymphedema, no erythema, non-tender. Skin:   Normal inspection Neuro/Psych:  No focal motor deficit, no abnormal mental status. Normal gait. Normal affect. Alert and oriented to person, place, and time  Genito Urinary: Vulva: Normal external female genitalia.  Bladder/urethra: Urethral meatus normal in size and location. No lesions or   masses, well supported bladder Speculum exam: Vagina: No lesion, no discharge, no bleeding. Cervix: Normal appearing, no lesions. Bimanual exam: limited due to habitus Uterus: Mobile. Size difficult to determine  Adnexa: No masses. Rectovaginal:  Good tone, no masses, no cul de sac nodularity, no parametrial involvement or nodularity.   [name], MD  10/28/2017, 3:48 PM

## 2017-10-29 ENCOUNTER — Encounter (HOSPITAL_COMMUNITY): Payer: Self-pay

## 2017-10-29 ENCOUNTER — Other Ambulatory Visit: Payer: Self-pay

## 2017-10-29 ENCOUNTER — Encounter (HOSPITAL_COMMUNITY)
Admission: RE | Admit: 2017-10-29 | Discharge: 2017-10-29 | Disposition: A | Discharge status: Home / Self Care | Payer: Self-pay | Source: Ambulatory Visit | Attending: Gynecologic Oncology | Admitting: Gynecologic Oncology

## 2017-10-29 DIAGNOSIS — Z01812 Encounter for preprocedural laboratory examination: Secondary | ICD-10-CM | POA: Insufficient documentation

## 2017-10-29 DIAGNOSIS — R1900 Intra-abdominal and pelvic swelling, mass and lump, unspecified site: Secondary | ICD-10-CM | POA: Insufficient documentation

## 2017-10-29 HISTORY — DX: Personal history of other venous thrombosis and embolism: Z86.718

## 2017-10-29 HISTORY — DX: Presence of spectacles and contact lenses: Z97.3

## 2017-10-29 HISTORY — DX: Constipation, unspecified: K59.00

## 2017-10-29 HISTORY — DX: Presence of dental prosthetic device (complete) (partial): Z97.2

## 2017-10-29 LAB — URINALYSIS, ROUTINE W REFLEX MICROSCOPIC
BILIRUBIN URINE: NEGATIVE
Bacteria, UA: NONE SEEN
Glucose, UA: NEGATIVE mg/dL
HGB URINE DIPSTICK: NEGATIVE
Ketones, ur: NEGATIVE mg/dL
LEUKOCYTES UA: NEGATIVE
NITRITE: NEGATIVE
Protein, ur: NEGATIVE mg/dL
SPECIFIC GRAVITY, URINE: 1.005 (ref 1.005–1.030)
pH: 7 (ref 5.0–8.0)

## 2017-10-29 LAB — CBC
HCT: 40.8 % (ref 36.0–46.0)
HEMOGLOBIN: 13.6 g/dL (ref 12.0–15.0)
MCH: 30.1 pg (ref 26.0–34.0)
MCHC: 33.3 g/dL (ref 30.0–36.0)
MCV: 90.3 fL (ref 78.0–100.0)
Platelets: 376 10*3/uL (ref 150–400)
RBC: 4.52 MIL/uL (ref 3.87–5.11)
RDW: 13.3 % (ref 11.5–15.5)
WBC: 11.3 10*3/uL — AB (ref 4.0–10.5)

## 2017-10-29 LAB — COMPREHENSIVE METABOLIC PANEL
ALBUMIN: 3.5 g/dL (ref 3.5–5.0)
ALT: 18 U/L (ref 0–44)
AST: 24 U/L (ref 15–41)
Alkaline Phosphatase: 42 U/L (ref 38–126)
Anion gap: 8 (ref 5–15)
BUN: 16 mg/dL (ref 6–20)
CO2: 26 mmol/L (ref 22–32)
CREATININE: 1.1 mg/dL — AB (ref 0.44–1.00)
Calcium: 9 mg/dL (ref 8.9–10.3)
Chloride: 107 mmol/L (ref 98–111)
GFR calc Af Amer: >60 mL/min (ref >60)
GFR calc non Af Amer: 58 mL/min — ABNORMAL LOW (ref >60)
GLUCOSE: 85 mg/dL (ref 70–99)
Potassium: 4.9 mmol/L (ref 3.5–5.1)
SODIUM: 141 mmol/L (ref 135–145)
Total Bilirubin: 1.4 mg/dL — ABNORMAL HIGH (ref 0.3–1.2)
Total Protein: 6.5 g/dL (ref 6.5–8.1)

## 2017-10-29 LAB — PREGNANCY, URINE: Preg Test, Ur: NEGATIVE

## 2017-10-29 LAB — ABO/RH: ABO/RH(D): B POS

## 2017-10-29 NOTE — Patient Instructions (Addendum)
[name]  10/29/2017   Your procedure is scheduled on:  10-30-2017  Report to [facility] Main  Entrance ,  Report to admitting at   9:30 AM    Call this number if you have problems the morning of surgery [phone]       Eat a light diet the day before surgery.  Examples including soups, broths, toast, yogurt, mashed potatoes.  Things to avoid include carbonated beverages (fizzy beverages), raw fruits and raw vegetables, or beans.               If your bowels are filled with gas, your surgeon will have difficulty visualizing your pelvic organs which increases your surgical risks.                           Bowel Prep:  Start at 4:00 PM day before surgery  2 bottles of magnesium citrate with clear liquid diet after this time.               Remember:  Do not eat food after midnight.  May have clear liquid diet from midnight until 8:30 AM day of surgery.  Complete ensure pre-surgery carbohydrate drink by 8:30 AM, after this nothing by mouth including water, candy, gum,                                  mints.   CLEAR LIQUID DIET   Foods Allowed                                                                     Foods Excluded  Coffee and tea, regular and decaf                             liquids that you cannot  Plain Jell-O in any flavor                                             see through such as: Fruit ices (not with fruit pulp)                                     milk, soups, orange juice  Iced Popsicles                                    All solid food Carbonated beverages, regular and diet                                    Cranberry, grape and apple juices Sports drinks like Gatorade Lightly seasoned clear broth or consume(fat free) Sugar, honey syrup  Sample Menu Breakfast  Lunch                                     Supper Cranberry juice                    Beef broth                            Chicken  broth Jell-O                                     Grape juice                           Apple juice Coffee or tea                        Jell-O                                      Popsicle                                                Coffee or tea                        Coffee or tea  _____________________________________________________________________                Take these medicines the morning of surgery with A SIP OF WATER:   NONE                                You may not have any metal on your body including hair pins and              piercings               Do not wear jewelry, make-up, lotions, powders or perfumes, deodorant             Do not wear nail polish.               Do not shave  48 hours prior to surgery.          Do not bring valuables to the hospital. [place].  Contacts, dentures or bridgework may not be worn into surgery.  Leave suitcase in the car. After surgery it may be brought to your room.    Special Instructions:   DEEP BREATHING, COUGH AND LEG EXERCISES              Please read over the following fact sheets you were given: _____________________________________________________________________   [facility] - Preparing for Surgery Before surgery, you can play an important role.  Because skin is not sterile, your skin needs to be as free of germs as possible.  You can reduce the number of germs on your skin by washing with  CHG (chlorahexidine gluconate) soap before surgery.  CHG is an antiseptic cleaner which kills germs and bonds with the skin to continue killing germs even after washing. Please DO NOT use if you have an allergy to CHG or antibacterial soaps.  If your skin becomes reddened/irritated stop using the CHG and inform your nurse when you arrive at Short Stay. Do not shave (including legs and underarms) for at least 48 hours prior to the first CHG shower.  You may shave your  face/neck. Please follow these instructions carefully:  1.  Shower with CHG Soap the night before surgery and the  morning of Surgery.  2.  If you choose to wash your hair, wash your hair first as usual with your  normal  shampoo.  3.  After you shampoo, rinse your hair and body thoroughly to remove the  shampoo.                                       4.  Use CHG as you would any other liquid soap.  You can apply chg directly  to the skin and wash                       Gently with a scrungie or clean washcloth.  5.  Apply the CHG Soap to your body ONLY FROM THE NECK DOWN.   Do not use on face/ open                           Wound or open sores. Avoid contact with eyes, ears mouth and genitals (private parts).                       Wash face,  Genitals (private parts) with your normal soap.             6.  Wash thoroughly, paying special attention to the area where your surgery  will be performed.  7.  Thoroughly rinse your body with warm water from the neck down.  8.  DO NOT shower/wash with your normal soap after using and rinsing off  the CHG Soap.             9.  Pat yourself dry with a clean towel.            10.  Wear clean pajamas.            11.  Place clean sheets on your bed the night of your first shower and do not  sleep with pets. Day of Surgery : Do not apply any lotions/deodorants the morning of surgery.  Please wear clean clothes to the hospital/surgery center.  FAILURE TO FOLLOW THESE INSTRUCTIONS MAY RESULT IN THE CANCELLATION OF YOUR SURGERY PATIENT SIGNATURE_________________________________  NURSE SIGNATURE__________________________________  ________________________________________________________________________   Adam Phenix  An incentive spirometer is a tool that can help keep your lungs clear and active. This tool measures how well you are filling your lungs with each breath. Taking long deep breaths may help reverse or decrease the chance of developing  breathing (pulmonary) problems (especially infection) following:  A long period of time when you are unable to move or be active. BEFORE THE PROCEDURE   If the spirometer includes an indicator to show your best effort, your nurse or respiratory therapist will set it to a  desired goal.  If possible, sit up straight or lean slightly forward. Try not to slouch.  Hold the incentive spirometer in an upright position. INSTRUCTIONS FOR USE  1. Sit on the edge of your bed if possible, or sit up as far as you can in bed or on a chair. 2. Hold the incentive spirometer in an upright position. 3. Breathe out normally. 4. Place the mouthpiece in your mouth and seal your lips tightly around it. 5. Breathe in slowly and as deeply as possible, raising the piston or the ball toward the top of the column. 6. Hold your breath for 3-5 seconds or for as long as possible. Allow the piston or ball to fall to the bottom of the column. 7. Remove the mouthpiece from your mouth and breathe out normally. 8. Rest for a few seconds and repeat Steps 1 through 7 at least 10 times every 1-2 hours when you are awake. Take your time and take a few normal breaths between deep breaths. 9. The spirometer may include an indicator to show your best effort. Use the indicator as a goal to work toward during each repetition. 10. After each set of 10 deep breaths, practice coughing to be sure your lungs are clear. If you have an incision (the cut made at the time of surgery), support your incision when coughing by placing a pillow or rolled up towels firmly against it. Once you are able to get out of bed, walk around indoors and cough well. You may stop using the incentive spirometer when instructed by your caregiver.  RISKS AND COMPLICATIONS  Take your time so you do not get dizzy or light-headed.  If you are in pain, you may need to take or ask for pain medication before doing incentive spirometry. It is harder to take a deep  breath if you are having pain. AFTER USE  Rest and breathe slowly and easily.  It can be helpful to keep track of a log of your progress. Your caregiver can provide you with a simple table to help with this. If you are using the spirometer at home, follow these instructions: [place] IF:   You are having difficultly using the spirometer.  You have trouble using the spirometer as often as instructed.  Your pain medication is not giving enough relief while using the spirometer.  You develop fever of 100.5 F (38.1 C) or higher. SEEK IMMEDIATE MEDICAL CARE IF:   You cough up bloody sputum that had not been present before.  You develop fever of 102 F (38.9 C) or greater.  You develop worsening pain at or near the incision site. MAKE SURE YOU:   Understand these instructions.  Will watch your condition.  Will get help right away if you are not doing well or get worse. Document Released: 06/24/2006 Document Revised: 05/06/2011 Document Reviewed: 08/25/2006 Exit[employer] Patient Information 2014 [employer], [place].   ________________________________________________________________________  WHAT IS A BLOOD TRANSFUSION? Blood Transfusion Information  A transfusion is the replacement of blood or some of its parts. Blood is made up of multiple cells which provide different functions.  Red blood cells carry oxygen and are used for blood loss replacement.  White blood cells fight against infection.  Platelets control bleeding.  Plasma helps clot blood.  Other blood products are available for specialized needs, such as hemophilia or other clotting disorders. BEFORE THE TRANSFUSION  Who gives blood for transfusions?   Healthy volunteers who are fully evaluated to make sure their  blood is safe. This is blood bank blood. Transfusion therapy is the safest it has ever been in the practice of medicine. Before blood is taken from a donor, a complete history is taken to make sure  that person has no history of diseases nor engages in risky social behavior (examples are intravenous drug use or sexual activity with multiple partners). The donor's travel history is screened to minimize risk of transmitting infections, such as malaria. The donated blood is tested for signs of infectious diseases, such as HIV and hepatitis. The blood is then tested to be sure it is compatible with you in order to minimize the chance of a transfusion reaction. If you or a relative donates blood, this is often done in anticipation of surgery and is not appropriate for emergency situations. It takes many days to process the donated blood. RISKS AND COMPLICATIONS Although transfusion therapy is very safe and saves many lives, the main dangers of transfusion include:   Getting an infectious disease.  Developing a transfusion reaction. This is an allergic reaction to something in the blood you were given. Every precaution is taken to prevent this. The decision to have a blood transfusion has been considered carefully by your caregiver before blood is given. Blood is not given unless the benefits outweigh the risks. AFTER THE TRANSFUSION  Right after receiving a blood transfusion, you will usually feel much better and more energetic. This is especially true if your red blood cells have gotten low (anemic). The transfusion raises the level of the red blood cells which carry oxygen, and this usually causes an energy increase.  The nurse administering the transfusion will monitor you carefully for complications. HOME CARE INSTRUCTIONS  No special instructions are needed after a transfusion. You may find your energy is better. Speak with your caregiver about any limitations on activity for underlying diseases you may have. SEEK MEDICAL CARE IF:   Your condition is not improving after your transfusion.  You develop redness or irritation at the intravenous (IV) site. SEEK IMMEDIATE MEDICAL CARE IF:  Any of  the following symptoms occur over the next 12 hours:  Shaking chills.  You have a temperature by mouth above 102 F (38.9 C), not controlled by medicine.  Chest, back, or muscle pain.  People around you feel you are not acting correctly or are confused.  Shortness of breath or difficulty breathing.  Dizziness and fainting.  You get a rash or develop hives.  You have a decrease in urine output.  Your urine turns a dark color or changes to pink, red, or brown. Any of the following symptoms occur over the next 10 days:  You have a temperature by mouth above 102 F (38.9 C), not controlled by medicine.  Shortness of breath.  Weakness after normal activity.  The white part of the eye turns yellow (jaundice).  You have a decrease in the amount of urine or are urinating less often.  Your urine turns a dark color or changes to pink, red, or brown. Document Released: 02/09/2000 Document Revised: 05/06/2011 Document Reviewed: 09/28/2007 [facility] Patient Information 2014 [place], [place].  _______________________________________________________________________

## 2017-10-30 ENCOUNTER — Ambulatory Visit (HOSPITAL_COMMUNITY)
Admission: RE | Admit: 2017-10-30 | Discharge: 2017-10-31 | Disposition: A | Discharge status: Home / Self Care | Payer: Self-pay | Source: Ambulatory Visit | Attending: Gynecologic Oncology | Admitting: Gynecologic Oncology

## 2017-10-30 ENCOUNTER — Encounter (HOSPITAL_COMMUNITY): Payer: Self-pay

## 2017-10-30 ENCOUNTER — Ambulatory Visit (HOSPITAL_COMMUNITY): Payer: Self-pay | Admitting: Certified Registered Nurse Anesthetist

## 2017-10-30 ENCOUNTER — Encounter (HOSPITAL_COMMUNITY)
Admission: RE | Disposition: A | Discharge status: Home / Self Care | Payer: Self-pay | Source: Ambulatory Visit | Attending: Gynecologic Oncology

## 2017-10-30 DIAGNOSIS — R1900 Intra-abdominal and pelvic swelling, mass and lump, unspecified site: Secondary | ICD-10-CM | POA: Diagnosis present

## 2017-10-30 DIAGNOSIS — D271 Benign neoplasm of left ovary: Secondary | ICD-10-CM | POA: Insufficient documentation

## 2017-10-30 DIAGNOSIS — R188 Other ascites: Secondary | ICD-10-CM | POA: Insufficient documentation

## 2017-10-30 DIAGNOSIS — Z79899 Other long term (current) drug therapy: Secondary | ICD-10-CM | POA: Insufficient documentation

## 2017-10-30 DIAGNOSIS — R971 Elevated cancer antigen 125 [CA 125]: Secondary | ICD-10-CM | POA: Insufficient documentation

## 2017-10-30 DIAGNOSIS — Z885 Allergy status to narcotic agent status: Secondary | ICD-10-CM | POA: Insufficient documentation

## 2017-10-30 DIAGNOSIS — I447 Left bundle-branch block, unspecified: Secondary | ICD-10-CM | POA: Insufficient documentation

## 2017-10-30 HISTORY — PX: ROBOTIC ASSISTED TOTAL HYSTERECTOMY WITH BILATERAL SALPINGO OOPHERECTOMY: SHX6086

## 2017-10-30 LAB — TYPE AND SCREEN
ABO/RH(D): B POS
Antibody Screen: NEGATIVE

## 2017-10-30 SURGERY — HYSTERECTOMY, TOTAL, ROBOT-ASSISTED, LAPAROSCOPIC, WITH BILATERAL SALPINGO-OOPHORECTOMY
Anesthesia: General

## 2017-10-30 MED ORDER — MIDAZOLAM HCL 5 MG/5ML IJ SOLN
INTRAMUSCULAR | Status: DC | PRN
  Administered 2017-10-30: 2 mg via INTRAVENOUS

## 2017-10-30 MED ORDER — DEXAMETHASONE SODIUM PHOSPHATE 10 MG/ML IJ SOLN
INTRAMUSCULAR | Status: DC | PRN
  Administered 2017-10-30: 6 mg via INTRAVENOUS

## 2017-10-30 MED ORDER — CELECOXIB 200 MG PO CAPS
400.0000 mg | ORAL_CAPSULE | ORAL | Status: AC
  Administered 2017-10-30: 400 mg via ORAL
  Filled 2017-10-30: qty 2

## 2017-10-30 MED ORDER — DEXAMETHASONE SODIUM PHOSPHATE 10 MG/ML IJ SOLN
INTRAMUSCULAR | Status: AC
  Filled 2017-10-30: qty 1

## 2017-10-30 MED ORDER — ONDANSETRON HCL 4 MG PO TABS: 4.0000 mg | ORAL_TABLET | Freq: Four times a day (QID) | ORAL | Status: DC | PRN

## 2017-10-30 MED ORDER — SUGAMMADEX SODIUM 500 MG/5ML IV SOLN
INTRAVENOUS | Status: AC
  Filled 2017-10-30: qty 5

## 2017-10-30 MED ORDER — HYDROMORPHONE HCL 2 MG/ML IJ SOLN
INTRAMUSCULAR | Status: AC
  Filled 2017-10-30: qty 1

## 2017-10-30 MED ORDER — MIDAZOLAM HCL 2 MG/2ML IJ SOLN
INTRAMUSCULAR | Status: AC
  Filled 2017-10-30: qty 2

## 2017-10-30 MED ORDER — KCL IN DEXTROSE-NACL 20-5-0.45 MEQ/L-%-% IV SOLN
INTRAVENOUS | Status: DC
  Administered 2017-10-30 (×2): via INTRAVENOUS
  Filled 2017-10-30: qty 1000

## 2017-10-30 MED ORDER — SCOPOLAMINE 1 MG/3DAYS TD PT72
1.0000 | MEDICATED_PATCH | TRANSDERMAL | Status: DC
  Administered 2017-10-30: 1.5 mg via TRANSDERMAL
  Filled 2017-10-30: qty 1

## 2017-10-30 MED ORDER — HYDROMORPHONE HCL 1 MG/ML IJ SOLN
INTRAMUSCULAR | Status: AC
  Filled 2017-10-30: qty 1

## 2017-10-30 MED ORDER — ACETAMINOPHEN 500 MG PO TABS
1000.0000 mg | ORAL_TABLET | Freq: Four times a day (QID) | ORAL | Status: DC
  Administered 2017-10-30 – 2017-10-31 (×4): 1000 mg via ORAL
  Filled 2017-10-30 (×4): qty 2

## 2017-10-30 MED ORDER — HYDROCHLOROTHIAZIDE 12.5 MG PO CAPS
12.5000 mg | ORAL_CAPSULE | Freq: Every day | ORAL | Status: DC
  Administered 2017-10-31: 12.5 mg via ORAL
  Filled 2017-10-30: qty 1

## 2017-10-30 MED ORDER — ALBUMIN HUMAN 5 % IV SOLN
INTRAVENOUS | Status: DC | PRN
  Administered 2017-10-30 (×2): via INTRAVENOUS

## 2017-10-30 MED ORDER — GABAPENTIN 300 MG PO CAPS
600.0000 mg | ORAL_CAPSULE | Freq: Every day | ORAL | Status: AC
  Administered 2017-10-30: 600 mg via ORAL
  Filled 2017-10-30: qty 2

## 2017-10-30 MED ORDER — LISINOPRIL-HYDROCHLOROTHIAZIDE 20-12.5 MG PO TABS: 1.0000 | ORAL_TABLET | Freq: Every morning | ORAL | Status: DC

## 2017-10-30 MED ORDER — ONDANSETRON HCL 4 MG/2ML IJ SOLN
INTRAMUSCULAR | Status: AC
  Filled 2017-10-30: qty 2

## 2017-10-30 MED ORDER — ALBUMIN HUMAN 5 % IV SOLN
INTRAVENOUS | Status: AC
  Filled 2017-10-30: qty 500

## 2017-10-30 MED ORDER — TRAMADOL HCL 50 MG PO TABS: 100.0000 mg | ORAL_TABLET | Freq: Four times a day (QID) | ORAL | Status: DC | PRN

## 2017-10-30 MED ORDER — PROPOFOL 10 MG/ML IV BOLUS
INTRAVENOUS | Status: AC
  Filled 2017-10-30: qty 20

## 2017-10-30 MED ORDER — SODIUM CHLORIDE 0.9 % IV SOLN
2.0000 g | Freq: Once | INTRAVENOUS | Status: AC
  Administered 2017-10-30 (×2): 2 g via INTRAVENOUS
  Filled 2017-10-30 (×2): qty 2

## 2017-10-30 MED ORDER — LIDOCAINE 2% (20 MG/ML) 5 ML SYRINGE
INTRAMUSCULAR | Status: AC
  Filled 2017-10-30: qty 5

## 2017-10-30 MED ORDER — PROMETHAZINE HCL 25 MG/ML IJ SOLN
6.2500 mg | INTRAMUSCULAR | Status: DC | PRN
  Administered 2017-10-30: 6.25 mg via INTRAVENOUS

## 2017-10-30 MED ORDER — KETOROLAC TROMETHAMINE 30 MG/ML IJ SOLN
30.0000 mg | Freq: Once | INTRAMUSCULAR | Status: AC | PRN
  Administered 2017-10-30: 30 mg via INTRAVENOUS

## 2017-10-30 MED ORDER — KETOROLAC TROMETHAMINE 30 MG/ML IJ SOLN
INTRAMUSCULAR | Status: AC
  Filled 2017-10-30: qty 1

## 2017-10-30 MED ORDER — PROPOFOL 10 MG/ML IV BOLUS
INTRAVENOUS | Status: DC | PRN
  Administered 2017-10-30: 150 mg via INTRAVENOUS

## 2017-10-30 MED ORDER — LACTATED RINGERS IR SOLN
Status: DC | PRN
  Administered 2017-10-30: 1

## 2017-10-30 MED ORDER — ONDANSETRON HCL 4 MG/2ML IJ SOLN: 4.0000 mg | Freq: Four times a day (QID) | INTRAMUSCULAR | Status: DC | PRN

## 2017-10-30 MED ORDER — SENNOSIDES-DOCUSATE SODIUM 8.6-50 MG PO TABS
2.0000 | ORAL_TABLET | Freq: Every day | ORAL | Status: DC
  Administered 2017-10-30: 2 via ORAL
  Filled 2017-10-30: qty 2

## 2017-10-30 MED ORDER — BUPIVACAINE HCL (PF) 0.25 % IJ SOLN
INTRAMUSCULAR | Status: DC | PRN
  Administered 2017-10-30: 20 mL

## 2017-10-30 MED ORDER — SODIUM CHLORIDE 0.9 % IJ SOLN
INTRAMUSCULAR | Status: DC | PRN
  Administered 2017-10-30: 10 mL

## 2017-10-30 MED ORDER — ENOXAPARIN SODIUM 40 MG/0.4ML ~~LOC~~ SOLN
40.0000 mg | SUBCUTANEOUS | Status: DC
  Administered 2017-10-31: 40 mg via SUBCUTANEOUS
  Filled 2017-10-30: qty 0.4

## 2017-10-30 MED ORDER — IBUPROFEN 800 MG PO TABS
800.0000 mg | ORAL_TABLET | Freq: Four times a day (QID) | ORAL | Status: DC
  Administered 2017-10-31: 800 mg via ORAL
  Filled 2017-10-30: qty 1

## 2017-10-30 MED ORDER — FENTANYL CITRATE (PF) 250 MCG/5ML IJ SOLN
INTRAMUSCULAR | Status: DC | PRN
  Administered 2017-10-30 (×5): 50 ug via INTRAVENOUS

## 2017-10-30 MED ORDER — ACETAMINOPHEN 500 MG PO TABS
1000.0000 mg | ORAL_TABLET | ORAL | Status: AC
  Administered 2017-10-30: 1000 mg via ORAL
  Filled 2017-10-30: qty 2

## 2017-10-30 MED ORDER — DEXAMETHASONE SODIUM PHOSPHATE 4 MG/ML IJ SOLN: 4.0000 mg | INTRAMUSCULAR | Status: DC

## 2017-10-30 MED ORDER — ROCURONIUM BROMIDE 10 MG/ML (PF) SYRINGE
PREFILLED_SYRINGE | INTRAVENOUS | Status: AC
  Filled 2017-10-30: qty 10

## 2017-10-30 MED ORDER — LIDOCAINE 2% (20 MG/ML) 5 ML SYRINGE
INTRAMUSCULAR | Status: DC | PRN
  Administered 2017-10-30: 100 mg via INTRAVENOUS

## 2017-10-30 MED ORDER — LIDOCAINE HCL 2 % IJ SOLN
INTRAMUSCULAR | Status: AC
  Filled 2017-10-30: qty 20

## 2017-10-30 MED ORDER — SUGAMMADEX SODIUM 200 MG/2ML IV SOLN
INTRAVENOUS | Status: DC | PRN
  Administered 2017-10-30: 300 mg via INTRAVENOUS

## 2017-10-30 MED ORDER — FENTANYL CITRATE (PF) 250 MCG/5ML IJ SOLN
INTRAMUSCULAR | Status: AC
  Filled 2017-10-30: qty 5

## 2017-10-30 MED ORDER — ENOXAPARIN SODIUM 40 MG/0.4ML ~~LOC~~ SOLN
40.0000 mg | SUBCUTANEOUS | Status: AC
  Administered 2017-10-30: 40 mg via SUBCUTANEOUS
  Filled 2017-10-30: qty 0.4

## 2017-10-30 MED ORDER — PROMETHAZINE HCL 25 MG/ML IJ SOLN
INTRAMUSCULAR | Status: AC
  Filled 2017-10-30: qty 1

## 2017-10-30 MED ORDER — LACTATED RINGERS IV SOLN
INTRAVENOUS | Status: DC
  Administered 2017-10-30: 10:00:00 via INTRAVENOUS

## 2017-10-30 MED ORDER — HYDROMORPHONE HCL 1 MG/ML IJ SOLN
INTRAMUSCULAR | Status: DC | PRN
  Administered 2017-10-30: 0.5 mg via INTRAVENOUS
  Administered 2017-10-30: 1 mg via INTRAVENOUS
  Administered 2017-10-30: 0.5 mg via INTRAVENOUS

## 2017-10-30 MED ORDER — STERILE WATER FOR IRRIGATION IR SOLN
Status: DC | PRN
  Administered 2017-10-30: 1000 mL

## 2017-10-30 MED ORDER — ONDANSETRON HCL 4 MG/2ML IJ SOLN
INTRAMUSCULAR | Status: DC | PRN
  Administered 2017-10-30 (×2): 4 mg via INTRAVENOUS

## 2017-10-30 MED ORDER — HYDROMORPHONE HCL 2 MG PO TABS: 2.0000 mg | ORAL_TABLET | ORAL | Status: DC | PRN

## 2017-10-30 MED ORDER — LIDOCAINE 2% (20 MG/ML) 5 ML SYRINGE
INTRAMUSCULAR | Status: DC | PRN
  Administered 2017-10-30: 1.5 mg/kg/h via INTRAVENOUS

## 2017-10-30 MED ORDER — KETAMINE HCL 10 MG/ML IJ SOLN
INTRAMUSCULAR | Status: DC | PRN
  Administered 2017-10-30 (×2): 30 mg via INTRAVENOUS

## 2017-10-30 MED ORDER — GABAPENTIN 300 MG PO CAPS
300.0000 mg | ORAL_CAPSULE | ORAL | Status: AC
  Administered 2017-10-30: 300 mg via ORAL
  Filled 2017-10-30: qty 1

## 2017-10-30 MED ORDER — BUPIVACAINE HCL (PF) 0.25 % IJ SOLN
INTRAMUSCULAR | Status: AC
  Filled 2017-10-30: qty 30

## 2017-10-30 MED ORDER — BUPIVACAINE LIPOSOME 1.3 % IJ SUSP
20.0000 mL | Freq: Once | INTRAMUSCULAR | Status: AC
  Administered 2017-10-30: 20 mL
  Filled 2017-10-30: qty 20

## 2017-10-30 MED ORDER — HYDROMORPHONE HCL 1 MG/ML IJ SOLN
0.2500 mg | INTRAMUSCULAR | Status: DC | PRN
  Administered 2017-10-30 (×3): 0.5 mg via INTRAVENOUS

## 2017-10-30 MED ORDER — ROCURONIUM BROMIDE 50 MG/5ML IV SOSY
PREFILLED_SYRINGE | INTRAVENOUS | Status: DC | PRN
  Administered 2017-10-30: 20 mg via INTRAVENOUS
  Administered 2017-10-30: 70 mg via INTRAVENOUS

## 2017-10-30 MED ORDER — SODIUM CHLORIDE 0.9 % IJ SOLN
INTRAMUSCULAR | Status: AC
  Filled 2017-10-30: qty 10

## 2017-10-30 MED ORDER — HYDROMORPHONE HCL 1 MG/ML IJ SOLN: 0.5000 mg | INTRAMUSCULAR | Status: DC | PRN

## 2017-10-30 MED ORDER — SUCCINYLCHOLINE CHLORIDE 200 MG/10ML IV SOSY
PREFILLED_SYRINGE | INTRAVENOUS | Status: AC
  Filled 2017-10-30: qty 10

## 2017-10-30 MED ORDER — LISINOPRIL 20 MG PO TABS
20.0000 mg | ORAL_TABLET | Freq: Every day | ORAL | Status: DC
  Administered 2017-10-31: 20 mg via ORAL
  Filled 2017-10-30: qty 1

## 2017-10-30 SURGICAL SUPPLY — 59 items
ADH SKN CLS APL DERMABOND .7 (GAUZE/BANDAGES/DRESSINGS) ×1
AGENT HMST KT MTR STRL THRMB (HEMOSTASIS)
APL ESCP 34 STRL LF DISP (HEMOSTASIS)
APPLICATOR SURGIFLO ENDO (HEMOSTASIS) IMPLANT
BAG LAPAROSCOPIC 12 15 PORT 16 (BASKET) IMPLANT
BAG RETRIEVAL 12/15 (BASKET)
BAG SPEC RTRVL LRG 6X4 10 (ENDOMECHANICALS)
COVER BACK TABLE 60X90IN (DRAPES) ×2 IMPLANT
COVER TIP SHEARS 8 DVNC (MISCELLANEOUS) ×1 IMPLANT
COVER TIP SHEARS 8MM DA VINCI (MISCELLANEOUS) ×1
DERMABOND ADVANCED (GAUZE/BANDAGES/DRESSINGS) ×1
DERMABOND ADVANCED .7 DNX12 (GAUZE/BANDAGES/DRESSINGS) ×1 IMPLANT
DRAPE ARM DVNC X/XI (DISPOSABLE) ×4 IMPLANT
DRAPE COLUMN DVNC XI (DISPOSABLE) ×1 IMPLANT
DRAPE DA VINCI XI ARM (DISPOSABLE) ×4
DRAPE DA VINCI XI COLUMN (DISPOSABLE) ×1
DRAPE SHEET LG 3/4 BI-LAMINATE (DRAPES) ×2 IMPLANT
DRAPE SURG IRRIG POUCH 19X23 (DRAPES) ×2 IMPLANT
DRSG OPSITE POSTOP 4X8 (GAUZE/BANDAGES/DRESSINGS) ×1 IMPLANT
ELECT PENCIL ROCKER SW 15FT (MISCELLANEOUS) ×1 IMPLANT
ELECT REM PT RETURN 15FT ADLT (MISCELLANEOUS) ×2 IMPLANT
GLOVE BIO SURGEON STRL SZ 6 (GLOVE) ×8 IMPLANT
GLOVE BIO SURGEON STRL SZ 6.5 (GLOVE) IMPLANT
GOWN STRL REUS W/ TWL LRG LVL3 (GOWN DISPOSABLE) ×2 IMPLANT
GOWN STRL REUS W/TWL LRG LVL3 (GOWN DISPOSABLE) ×4
HOLDER FOLEY CATH W/STRAP (MISCELLANEOUS) ×2 IMPLANT
IRRIG SUCT STRYKERFLOW 2 WTIP (MISCELLANEOUS) ×2
IRRIGATION SUCT STRKRFLW 2 WTP (MISCELLANEOUS) ×1 IMPLANT
KIT PROCEDURE DA VINCI SI (MISCELLANEOUS)
KIT PROCEDURE DVNC SI (MISCELLANEOUS) IMPLANT
MANIPULATOR UTERINE 4.5 ZUMI (MISCELLANEOUS) ×2 IMPLANT
NDL SPNL 18GX3.5 QUINCKE PK (NEEDLE) IMPLANT
NEEDLE SPNL 18GX3.5 QUINCKE PK (NEEDLE) IMPLANT
OBTURATOR OPTICAL STANDARD 8MM (TROCAR) ×1
OBTURATOR OPTICAL STND 8 DVNC (TROCAR) ×1
OBTURATOR OPTICALSTD 8 DVNC (TROCAR) ×1 IMPLANT
PACK ROBOT GYN CUSTOM WL (TRAY / TRAY PROCEDURE) ×2 IMPLANT
PAD POSITIONING PINK XL (MISCELLANEOUS) ×2 IMPLANT
PORT ACCESS TROCAR AIRSEAL 12 (TROCAR) ×1 IMPLANT
PORT ACCESS TROCAR AIRSEAL 5M (TROCAR) ×1
POUCH SPECIMEN RETRIEVAL 10MM (ENDOMECHANICALS) IMPLANT
SEAL CANN UNIV 5-8 DVNC XI (MISCELLANEOUS) ×4 IMPLANT
SEAL XI 5MM-8MM UNIVERSAL (MISCELLANEOUS) ×4
SET TRI-LUMEN FLTR TB AIRSEAL (TUBING) ×2 IMPLANT
SPONGE LAP 18X18 X RAY DECT (DISPOSABLE) ×2 IMPLANT
SURGIFLO W/THROMBIN 8M KIT (HEMOSTASIS) IMPLANT
SUT MNCRL AB 4-0 PS2 18 (SUTURE) ×1 IMPLANT
SUT PDS AB 1 TP1 96 (SUTURE) ×2 IMPLANT
SUT VIC AB 0 CT1 27 (SUTURE)
SUT VIC AB 0 CT1 27XBRD ANTBC (SUTURE) IMPLANT
SUT VIC AB 3-0 SH 27 (SUTURE) ×4
SUT VIC AB 3-0 SH 27X BRD (SUTURE) IMPLANT
SYR 10ML LL (SYRINGE) IMPLANT
TOWEL OR NON WOVEN STRL DISP B (DISPOSABLE) ×2 IMPLANT
TRAP SPECIMEN MUCOUS 40CC (MISCELLANEOUS) IMPLANT
TRAY FOLEY MTR SLVR 16FR STAT (SET/KITS/TRAYS/PACK) ×2 IMPLANT
UNDERPAD 30X30 (UNDERPADS AND DIAPERS) ×2 IMPLANT
WATER STERILE IRR 1000ML POUR (IV SOLUTION) ×2 IMPLANT
YANKAUER SUCT BULB TIP 10FT TU (MISCELLANEOUS) ×1 IMPLANT

## 2017-10-30 NOTE — Anesthesia Preprocedure Evaluation (Signed)
Anesthesia Evaluation  Patient identified by MRN, date of birth, ID band Patient awake    Reviewed: Allergy & Precautions, NPO status , Patient's Chart, lab work & pertinent test results  Airway Mallampati: II  TM Distance: >3 FB Neck ROM: Full    Dental  (+) Upper Dentures   Pulmonary neg pulmonary ROS, Current Smoker,    Pulmonary exam normal breath sounds clear to auscultation       Cardiovascular hypertension, negative cardio ROS Normal cardiovascular exam Rhythm:Regular Rate:Normal     Neuro/Psych negative neurological ROS  negative psych ROS   GI/Hepatic negative GI ROS, Neg liver ROS,   Endo/Other  negative endocrine ROS  Renal/GU negative Renal ROS  negative genitourinary   Musculoskeletal negative musculoskeletal ROS (+)   Abdominal   Peds negative pediatric ROS (+)  Hematology negative hematology ROS (+)   Anesthesia Other Findings   Reproductive/Obstetrics negative OB ROS                             Anesthesia Physical Anesthesia Plan  ASA: II  Anesthesia Plan: General   Post-op Pain Management:    Induction: Intravenous  PONV Risk Score and Plan: 4 or greater and Ondansetron, Dexamethasone, Treatment may vary due to age or medical condition, Midazolam and Scopolamine patch - Pre-op  Airway Management Planned: Oral ETT  Additional Equipment:   Intra-op Plan:   Post-operative Plan: Extubation in OR  Informed Consent: I have reviewed the patients History and Physical, chart, labs and discussed the procedure including the risks, benefits and alternatives for the proposed anesthesia with the patient or authorized representative who has indicated his/her understanding and acceptance.   Dental advisory given  Plan Discussed with: CRNA and Surgeon  Anesthesia Plan Comments:         Anesthesia Quick Evaluation

## 2017-10-30 NOTE — Transfer of Care (Signed)
Immediate Anesthesia Transfer of Care Note  Patient: [name]  Procedure(s) Performed: XI ROBOTIC ASSISTED TOTAL HYSTERECTOMY WITH BILATERAL SALPINGO OOPHORECTOMY MINI LAPAROTOMY (N/A )  Patient Location: PACU  Anesthesia Type:General  Level of Consciousness: awake, alert  and oriented  Airway & Oxygen Therapy: Patient connected to face mask  Post-op Assessment: Report given to RN and Post -op Vital signs reviewed and stable  Post vital signs: Reviewed and stable  Last Vitals:  Vitals Value Taken Time  BP    Temp    Pulse    Resp    SpO2      Last Pain:  Vitals:   10/30/17 1008  TempSrc:   PainSc: 0-No pain         Complications: No apparent anesthesia complications

## 2017-10-30 NOTE — Op Note (Signed)
OPERATIVE NOTE 10/30/17  Surgeon: Donaciano Eva   Assistants: Dr Lahoma Crocker (an MD assistant was necessary for tissue manipulation, management of robotic instrumentation, retraction and positioning due to the complexity of the case and hospital policies).   Anesthesia: General endotracheal anesthesia  ASA Class: 3   Pre-operative Diagnosis: pelvic mass, ascites, elevated CA 125  Post-operative Diagnosis: left ovarian fibroma (benign)  Operation: Robotic-assisted laparoscopic total hysterectomy with bilateral salpingoophorectomy, minilaparotomy for specimen delivery   Surgeon: Donaciano Eva  Assistant Surgeon: Lahoma Crocker MD  Anesthesia: GET  Urine Output: 50cc  Operative Findings:  : 3000cc amber ascites, 16cm solid, smooth surfaced left ovarian mass. No carcinomatosis, no omental disease, normal diaphragms. Frozen section consistent with fibroma.  Estimated Blood Loss:  less than 100 mL      Total IV Fluids: 2000 ml         Specimens: ascites for cytology, uterus with cervix and right tube and ovary, left tube and ovary         Complications:  None; patient tolerated the procedure well.         Disposition: PACU - hemodynamically stable.  Procedure Details  The patient was seen in the Holding Room. The risks, benefits, complications, treatment options, and expected outcomes were discussed with the patient.  The patient concurred with the proposed plan, giving informed consent.  The site of surgery properly noted/marked. The patient was identified as [name] and the procedure verified as a Robotic-assisted hysterectomy with bilateral salpingo oophorectomy. A Time Out was held and the above information confirmed.  After induction of anesthesia, the patient was draped and prepped in the usual sterile manner. Pt was placed in supine position after anesthesia and draped and prepped in the usual sterile manner. The abdominal drape was placed  after the CholoraPrep had been allowed to dry for 3 minutes.  Her arms were tucked to her side with all appropriate precautions.  The shoulders were stabilized with padded shoulder blocks applied to the acromium processes.  The patient was placed in the semi-lithotomy position in [place].  The perineum was prepped with Betadine. The patient was then prepped. Foley catheter was placed.  A sterile speculum was placed in the vagina.  The cervix was grasped with a single-tooth tenaculum and dilated with Kennon Rounds dilators.  The ZUMI uterine manipulator with a medium colpotomizer ring was placed without difficulty.  A pneum occluder balloon was placed over the manipulator.  OG tube placement was confirmed and to suction.   Next, a 5 mm skin incision was made 1 cm below the subcostal margin in the midclavicular line.  The 5 mm Optiview port and scope was used for direct entry.  Opening pressure was under 10 mm CO2.  The abdomen was insufflated and the findings were noted as above.   At this point and all points during the procedure, the patient's intra-abdominal pressure did not exceed 15 mmHg. Next, a 10 mm skin incision was made in the umbilicus and a right and left port was placed about 10 cm lateral to the robot port on the right and left side.  A fourth arm was placed in the left lower quadrant 2 cm above and superior and medial to the anterior superior iliac spine.  All ports were placed under direct visualization.  The patient was placed in steep Trendelenburg.  Bowel was folded away into the upper abdomen.  3L of amber ascites was aspirated from the abdomen. The robot was docked  in the normal manner.  The hysterectomy was started after the round ligament on the right side was incised and the retroperitoneum was entered and the pararectal space was developed.  The ureter was noted to be on the medial leaf of the broad ligament.  The peritoneum above the ureter was incised and stretched and the  infundibulopelvic ligament was skeletonized, cauterized and cut.  The posterior peritoneum was taken down to the level of the KOH ring.  The anterior peritoneum was also taken down.  The bladder flap was created to the level of the KOH ring.  The uterine artery on the right side was skeletonized, cauterized and cut in the normal manner.  A similar procedure was performed on the left.  The colpotomy was made and the uterus, cervix, bilateral ovaries and tubes were amputated but not delivered through the vagina due to the size of the ovarian mass.  Pedicles were inspected and excellent hemostasis was achieved.    The colpotomy at the vaginal cuff was closed with Vicryl on a CT1 needle in a running manner.  Irrigation was used and excellent hemostasis was achieved.  At this point in the robotic procedure was completed.  Robotic instruments were removed under direct visulaization.  The robot was undocked.   A 6cm vertical midline upper abdominal incision was made with the scalpel. The subcutaneous skin was opened with the bovie. The fascia was opened with the bovie vertically. The peritoneum was opened sharply in the midline. The peritoneal incision was extended. The uterus, tubes and ovaries were retrieved through the incision. The fascia was closed with running 0-vicryl. The subcutaneous fat was closed with 2-0 vicryl. 20cc of exparel mixed with 20cc of marcaine was infiltrated into the incision. The incision was closed at the skin with monocryl and dermabond.   The 10 mm ports were closed with Vicryl on a UR-5 needle and the fascia was closed with 0 Vicryl on a UR-5 needle. The skin was closed with 4-0 Vicryl in a subcuticular manner.  Dermabond was applied.  Sponge, lap and needle counts correct x 2.  The patient was taken to the recovery room in stable condition.  The vagina was swabbed with  minimal bleeding noted.   All instrument and needle counts were correct x  3.   The patient was transferred to the  recovery room in a stable condition.  Donaciano Eva, MD

## 2017-10-30 NOTE — Anesthesia Postprocedure Evaluation (Signed)
Anesthesia Post Note  Patient: [name]  Procedure(s) Performed: XI ROBOTIC ASSISTED TOTAL HYSTERECTOMY WITH BILATERAL SALPINGO OOPHORECTOMY MINI LAPAROTOMY (N/A )     Patient location during evaluation: PACU Anesthesia Type: General Level of consciousness: awake and alert Pain management: pain level controlled Vital Signs Assessment: post-procedure vital signs reviewed and stable Respiratory status: spontaneous breathing, nonlabored ventilation, respiratory function stable and patient connected to nasal cannula oxygen Cardiovascular status: blood pressure returned to baseline and stable Postop Assessment: no apparent nausea or vomiting Anesthetic complications: no    Last Vitals:  Vitals:   10/30/17 0937 10/30/17 1445  BP: 135/86 (!) 153/109  Pulse: 78 100  Resp: 18 (!) 21  Temp: 36.7 C 36.7 C  SpO2: 97% 99%    Last Pain:  Vitals:   10/30/17 1445  TempSrc:   PainSc: 10-Worst pain ever                 Jame Seelig S

## 2017-10-30 NOTE — Interval H&P Note (Signed)
History and Physical Interval Note:  10/30/2017 11:51 AM  [name]  has presented today for surgery, with the diagnosis of PELVIC MASS  The various methods of treatment have been discussed with the patient and family. After consideration of risks, benefits and other options for treatment, the patient has consented to  Procedure(s): XI ROBOTIC ASSISTED TOTAL HYSTERECTOMY WITH BILATERAL SALPINGO OOPHORECTOMY POSSIBLE EXPL LAPAROTOMY, POSSIBLE STAGING, POSSIBLE STAGING (N/A) as a surgical intervention .  The patient's history has been reviewed, patient examined, no change in status, stable for surgery.  I have reviewed the patient's chart and labs.  Questions were answered to the patient's satisfaction.     [name]

## 2017-10-30 NOTE — Anesthesia Procedure Notes (Signed)
Procedure Name: Intubation Date/Time: 10/30/2017 12:22 PM Performed by: Maxwell Caul, CRNA Pre-anesthesia Checklist: Patient identified, Emergency Drugs available, Suction available and Patient being monitored Patient Re-evaluated:Patient Re-evaluated prior to induction Oxygen Delivery Method: Circle system utilized Preoxygenation: Pre-oxygenation with 100% oxygen Induction Type: IV induction Ventilation: Mask ventilation without difficulty Laryngoscope Size: Mac and 4 Grade View: Grade I Tube type: Oral Tube size: 7.5 mm Number of attempts: 1 Airway Equipment and Method: Stylet Placement Confirmation: ETT inserted through vocal cords under direct vision,  positive ETCO2 and breath sounds checked- equal and bilateral Secured at: 21 cm Tube secured with: Tape Dental Injury: Teeth and Oropharynx as per pre-operative assessment

## 2017-10-31 ENCOUNTER — Encounter (HOSPITAL_COMMUNITY): Payer: Self-pay | Admitting: Gynecologic Oncology

## 2017-10-31 LAB — BASIC METABOLIC PANEL
Anion gap: 6 (ref 5–15)
BUN: 17 mg/dL (ref 6–20)
CALCIUM: 8.5 mg/dL — AB (ref 8.9–10.3)
CO2: 26 mmol/L (ref 22–32)
CREATININE: 1.06 mg/dL — AB (ref 0.44–1.00)
Chloride: 107 mmol/L (ref 98–111)
GFR calc non Af Amer: >60 mL/min (ref >60)
GLUCOSE: 126 mg/dL — AB (ref 70–99)
Potassium: 5 mmol/L (ref 3.5–5.1)
Sodium: 139 mmol/L (ref 135–145)

## 2017-10-31 LAB — CBC
HCT: 36.2 % (ref 36.0–46.0)
Hemoglobin: 12 g/dL (ref 12.0–15.0)
MCH: 30.2 pg (ref 26.0–34.0)
MCHC: 33.1 g/dL (ref 30.0–36.0)
MCV: 91 fL (ref 78.0–100.0)
Platelets: 319 10*3/uL (ref 150–400)
RBC: 3.98 MIL/uL (ref 3.87–5.11)
RDW: 13.3 % (ref 11.5–15.5)
WBC: 17 10*3/uL — AB (ref 4.0–10.5)

## 2017-10-31 MED ORDER — DIPHENHYDRAMINE HCL 25 MG PO CAPS
ORAL_CAPSULE | ORAL | Status: AC
  Administered 2017-10-31: 25 mg
  Filled 2017-10-31: qty 1

## 2017-10-31 MED ORDER — OXYCODONE HCL 5 MG PO TABS: 5.0000 mg | ORAL_TABLET | Freq: Four times a day (QID) | ORAL | 0 refills | Status: DC | PRN

## 2017-10-31 MED ORDER — DIPHENHYDRAMINE HCL 25 MG PO CAPS: 25.0000 mg | ORAL_CAPSULE | Freq: Four times a day (QID) | ORAL | Status: DC | PRN

## 2017-10-31 NOTE — Discharge Summary (Signed)
Physician Discharge Summary  Patient ID: [name] MRN: [id number] DOB/AGE: [birth year] 47 y.o.  Admit date: 10/30/2017 Discharge date: 10/31/2017  Admission Diagnoses: Pelvic mass  Discharge Diagnoses:  Principal Problem:   Pelvic mass Active Problems:   Elevated CA-125   Ascites   Pelvic mass in female   Discharged Condition:  The patient is in good condition and stable for discharge.   Hospital Course: On 10/30/2017, the patient underwent the following: Procedure(s): XI ROBOTIC ASSISTED TOTAL HYSTERECTOMY WITH BILATERAL SALPINGO OOPHORECTOMY MINI LAPAROTOMY.  The postoperative course was uneventful.  She was discharged to home on postoperative day 1 tolerating a regular diet, ambulating, voiding, minimal pain.  EKG was performed on POD 1 due to findings of a systolic murmur on examination which the patient stated she was unaware that she had but she has not received regular medical care for some time and does not have a PCP.  EKG with normal sinus rhythm with left bundle branch block.  Dr. Gerarda Fraction and Dr. Denman George aware.  Recommendation for follow up outpatient.  Advised patient that our office could make a referral to a primary care provider to establish care and for follow up on the EKG and heart murmur but patient stating she would like to hold off on seeing anyone until she had recovered from this surgery.  Stressed the importance but patient would like to wait at this time.  Will readdress at her post-operative visit.  Information on left bundle branch block given to patient in discharge instructions.  Consults: None  Significant Diagnostic Studies: None  Treatments: surgery: see above  Discharge Exam: Blood pressure 100/60, pulse 71, temperature 99.1 F (37.3 C), temperature source Oral, resp. rate 18, height 5\' 7"  (1.702 m), weight 240 lb (108.9 kg), SpO2 99 %. General appearance: alert, cooperative and no distress Resp: clear to auscultation bilaterally Cardio: regular  rate and rhythm, systolic murmur noted GI: soft, non-tender; bowel sounds normal; no masses,  no organomegaly Extremities: extremities normal, atraumatic, no cyanosis or edema Incision/Wound: Lap sites to the abdomen with dermabond along with midline incision with op site in place.  No drainage. Moderate erythema noted under the midline incision and right lap site.  Appearing to be a reaction possibly to agent used in OR like exparel.  See media for photo.  Dr. Gerarda Fraction and Dr. Denman George aware.  No specific intervention at this time.  One oral dose of Benadryl 25 mg given.  Patient advised to monitor and call for worsening or persistent symptoms.  Disposition:    Discharge Instructions    Call MD for:  difficulty breathing, headache or visual disturbances   Complete by:  As directed    Call MD for:  extreme fatigue   Complete by:  As directed    Call MD for:  hives   Complete by:  As directed    Call MD for:  persistant dizziness or light-headedness   Complete by:  As directed    Call MD for:  persistant nausea and vomiting   Complete by:  As directed    Call MD for:  redness, tenderness, or signs of infection (pain, swelling, redness, odor or green/yellow discharge around incision site)   Complete by:  As directed    Call MD for:  severe uncontrolled pain   Complete by:  As directed    Call MD for:  temperature >100.4   Complete by:  As directed    Diet - low sodium heart healthy   Complete  by:  As directed    Discharge wound care:   Complete by:  As directed    Remove the dressing from your abdomen five days after surgery   Driving Restrictions   Complete by:  As directed    No driving for 2 weeks.  Do not take narcotics and drive.   Increase activity slowly   Complete by:  As directed    Lifting restrictions   Complete by:  As directed    No lifting greater than 10 lbs.   Sexual Activity Restrictions   Complete by:  As directed    No sexual activity, nothing in the vagina, for 8  weeks.     Allergies as of 10/31/2017      Reactions   Codeine Other (See Comments)   Unknown reaction, mother was allergic      Medication List    STOP taking these medications   medroxyPROGESTERone 10 MG tablet Commonly known as:  PROVERA     TAKE these medications   EX-LAX PO Take by mouth as needed.   ibuprofen 200 MG tablet Commonly known as:  ADVIL,MOTRIN Take 600 mg by mouth every 6 (six) hours as needed for headache or moderate pain.   lisinopril-hydrochlorothiazide 20-12.5 MG tablet Commonly known as:  PRINZIDE,ZESTORETIC Take 1 tablet by mouth every morning.   MIRALAX PO Take by mouth daily as needed.   oxyCODONE 5 MG immediate release tablet Commonly known as:  Oxy IR/ROXICODONE Take 1 tablet (5 mg total) by mouth every 6 (six) hours as needed for severe pain.            Discharge Care Instructions  (From admission, onward)         Start     Ordered   10/31/17 0000  Discharge wound care:    Comments:  Remove the dressing from your abdomen five days after surgery   10/31/17 1432         Follow-up Information    Everitt Amber, MD Follow up on 11/21/2017.   Specialty:  Gynecologic Oncology Why:  at 9 am Contact information: [address] [place] [address] [phone]           Greater than thirty minutes were spend for face to face discharge instructions and discharge orders/summary in EPIC.   Signed: Dorothyann Gibbs 10/31/2017, 8:08 PM

## 2017-10-31 NOTE — Progress Notes (Signed)
Discharge instructions reviewed with patient. All questions answered. Patient given Benadryl prior to discharge for incision redness, informed patient to monitor at home and continue OTC dose. Patient wheeled to vehicle with belongings by nurse tech

## 2017-10-31 NOTE — Discharge Instructions (Signed)
10/30/2017  Return to work: 4 weeks  Activity: 1. Be up and out of the bed during the day.  Take a nap if needed.  You may walk up steps but be careful and use the hand rail.  Stair climbing will tire you more than you think, you may need to stop part way and rest.   2. No lifting or straining for 6 weeks.  3. No driving for 1 weeks.  Do Not drive if you are taking narcotic pain medicine.  4. Shower daily.  Use soap and water on your incision and pat dry; don't rub.   5. No sexual activity and nothing in the vagina for 8 weeks.  Medications:  - Take ibuprofen and tylenol first line for pain control. Take these regularly (every 6 hours) to decrease the build up of pain.  - If necessary, for severe pain not relieved by ibuprofen, take oxycodone.  - While taking oxycodone you should take sennakot every night to reduce the likelihood of constipation. If this causes diarrhea, stop its use.  Diet: 1. Low sodium Heart Healthy Diet is recommended.  2. It is safe to use a laxative if you have difficulty moving your bowels.   Wound Care: 1. Keep clean and dry.  Shower daily.  Reasons to call the Doctor:   Fever - Oral temperature greater than 100.4 degrees Fahrenheit  Foul-smelling vaginal discharge  Difficulty urinating  Nausea and vomiting  Increased pain at the site of the incision that is unrelieved with pain medicine.  Difficulty breathing with or without chest pain  New calf pain especially if only on one side  Sudden, continuing increased vaginal bleeding with or without clots.   Follow-up: 1. See Everitt Amber in 3-4 weeks.  Contacts: For questions or concerns you should contact:  Dr. Everitt Amber at [phone] After hours and on week-ends call [phone] and ask to speak to the physician on call for Gynecologic Oncology   Left Bundle Branch Block  Left bundle branch block (LBBB) is a problem with the way that electrical impulses pass through the heart  (electrical conduction abnormality). The heart depends on an electrical pulse to beat normally. The electrical signal for a heartbeat starts in the upper chambers of the heart (atria) and then travels to the two lower chambers (left and rightventricles). An LBBB is a partial or complete block of the pathway that carries the signal to the left ventricle. If you have LBBB, the left side of your heart does not beat normally. LBBB may be a warning sign of heart disease. What are the causes? This condition may be caused by:  Heart disease.  Disease of the arteries in the heart (arteriosclerosis).  Stiffening or weakening of heart muscle (cardiomyopathy).  Infection of heart muscle (myocarditis).  High blood pressure (hypertension).  In some cases, the cause may not be known. What increases the risk? The following factors may make you more likely to develop this condition:  Being female.  Being 79 years of age or older.  Having heart disease.  Having had a heart attack or heart surgery.  What are the signs or symptoms? This condition may not cause any symptoms. If you do have symptoms, they may include:  Feeling dizzy or light-headed.  Fainting.  How is this diagnosed? This condition may be diagnosed based on an electrocardiogram (ECG). It is often diagnosed when an ECG is done as part of a routine physical or to help find the cause of  fainting spells. You may also have imaging tests to find out more about your condition. These may include:  Chest X-rays.  Echo test (echocardiogram).  How is this treated? If you do not have symptoms or any other type of heart disease, you may not need treatment for this condition. However, you may need to see your health care provider more often because LBBB can be a warning sign of future heart problems. You may get treatment for other heart problems or high blood pressure. If LBBB causes symptoms or other heart problems, you may need to have an  electrical device (pacemaker) implanted under the skin of your chest. A pacemaker sends electrical signals to your heart to keep it beating normally. Follow these instructions at home:  Follow instructions from your health care provider about eating or drinking restrictions.  Take over-the-counter and prescription medicines only as told by your health care provider.  Return to your normal activities as told by your health care provider. Ask your health care provider what activities are safe for you.  Follow a heart-healthy diet and maintain a healthy weight. Work with a diet and nutrition specialist (dietitian) to create an eating plan that is best for you.  Get regular exercise as told by your health care provider.  Do not use any products that contain nicotine or tobacco, such as cigarettes and e-cigarettes. If you need help quitting, ask your health care provider.  Keep all follow-up visits as told by your health care provider. This is important. Contact a health care provider if:  You are light-headed or you faint. Get help right away if:  You have chest pain.  You have difficulty breathing. These symptoms may represent a serious problem that is an emergency. Do not wait to see if the symptoms will go away. Get medical help right away. Call your local emergency services (911 in the [place] Do not drive yourself to the hospital. Summary  For the heart to beat normally, an electrical signal must travel to the lower left chamber of the heart (left ventricle). Left bundle branch block (LBBB) is a partial or complete block of the pathway that carries that signal.  This condition may not cause any symptoms. In some cases, a person may feel dizzy or light-headed or may faint.  Treatment may not be needed for LBBB if you do not have symptoms or any other type of heart disease.  You may need to see your health care provider more often because LBBB can be a warning sign of future heart  problems. This information is not intended to replace advice given to you by your health care provider. Make sure you discuss any questions you have with your health care provider. Document Released: 04/02/2016 Document Revised: 04/02/2016 Document Reviewed: 04/02/2016 Elsevier Interactive Patient Education  2018 [employer].

## 2017-10-31 NOTE — Progress Notes (Addendum)
1 Day Post-Op Procedure(s) (LRB): XI ROBOTIC ASSISTED TOTAL HYSTERECTOMY WITH BILATERAL SALPINGO OOPHORECTOMY MINI LAPAROTOMY (N/A)  Subjective: Patient reports feeling better this am.  Tolerated solid food this am with no nausea or emesis.  Ambulating without difficulty.  Voiding since foley removal. Minimal pain reported in abdominal incision.  No flatus or BM. Denies chest pain, dyspnea, lightheadedness.  Patient unaware that she has a murmur and states she does not have a primary care physician.  All questions answered.  No concerns voiced.  Objective: Vital signs in last 24 hours: Temp:  [97.6 F (36.4 C)-99 F (37.2 C)] 99 F (37.2 C) (09/06 0538) Pulse Rate:  [60-100] 60 (09/06 0538) Resp:  [12-21] 18 (09/06 0538) BP: (102-153)/(52-109) 107/83 (09/06 0916) SpO2:  [94 %-100 %] 99 % (09/06 0538) Weight:  [240 lb (108.9 kg)] 240 lb (108.9 kg) (09/05 0945) Last BM Date: 10/29/17  Intake/Output from previous day: 09/05 0701 - 09/06 0700 In: 3922.2 [P.O.:840; I.V.:2482.2; IV Piggyback:600] Out: 2050 [Urine:1950; Blood:100]  Physical Examination: General: alert, cooperative and no distress Resp: clear to auscultation bilaterally Cardio: regular rate and rhythm, systolic murmur noted GI: incision: lap sites with dermabond without erythema or drainage, mini-lap incision midline with op site dressing in place with no active drainage noted and abdomen obese, soft, hypoactive bowel sounds Extremities: extremities normal, atraumatic, no cyanosis or edema  Labs: WBC/Hgb/Hct/Plts:  17.0/12.0/36.2/319 (09/06 0431) BUN/Cr/glu/ALT/AST/amyl/lip:  17/1.06/--/--/--/--/-- (09/06 0431)  Assessment: 47 y.o. s/p Procedure(s): XI ROBOTIC ASSISTED TOTAL HYSTERECTOMY WITH BILATERAL SALPINGO OOPHORECTOMY MINI LAPAROTOMY: stable Pain:  Pain is well-controlled on PRN medications.  Heme: Hgb 12.0 and Hct 36.2 this am.  Appropriate this am compared with pre-op levels.  CV: BP and HR stable.  Systolic  murmur noted- asymptomatic.  No pre-op EKG in Epic.  GI:  Tolerating po: Yes.  Antiemetics ordered if needed.  GU: Voiding since foley removal.  Adequate output recorded. Creatinine slightly increased at 1.06  FEN: Appropriate this am.  Prophylaxis: pharmacologic prophylaxis (with any of the following: enoxaparin (Lovenox) 40mg  SQ 2 hours prior to surgery then every day) and intermittent pneumatic compression boots.  Plan: EKG now for baseline and with finding of systolic murmur Saline lock IV Plan for discharge later today if patient tolerating diet, ambulating, voiding, pain controlled, EKG with no acute abnormalities. Continue plan per Dr. Gerarda Fraction   LOS: 0 days    [name] 10/31/2017, 9:36 AM   Patient seen and examined with M.Cross. Agree with above. She was informed to followup with PCP outpatient for murmur pending our EKG today. Plan for home if no acute abnormalities.

## 2017-11-03 ENCOUNTER — Telehealth: Payer: Self-pay

## 2017-11-03 NOTE — Telephone Encounter (Signed)
Outgoing call to patient per Joylene John NP regarding pathology report "path benign" "not cancer" and also to see how she is doing post op "how is redness below incision, is it better?" Pt voiced understanding of benign path results and reports the redness is no longer there.  Reminded her of upcoming appt with Dr Denman George on 9-27. No other needs per pt at this time.

## 2017-11-21 ENCOUNTER — Inpatient Hospital Stay (HOSPITAL_BASED_OUTPATIENT_CLINIC_OR_DEPARTMENT_OTHER): Payer: Self-pay | Admitting: Gynecologic Oncology

## 2017-11-21 ENCOUNTER — Encounter: Payer: Self-pay | Admitting: Gynecologic Oncology

## 2017-11-21 VITALS — BP 138/91 | HR 66 | Temp 98.3°F | Resp 20 | Ht 67.0 in | Wt 236.1 lb

## 2017-11-21 DIAGNOSIS — Z90722 Acquired absence of ovaries, bilateral: Secondary | ICD-10-CM

## 2017-11-21 DIAGNOSIS — R1900 Intra-abdominal and pelvic swelling, mass and lump, unspecified site: Secondary | ICD-10-CM

## 2017-11-21 DIAGNOSIS — Z90710 Acquired absence of both cervix and uterus: Secondary | ICD-10-CM

## 2017-11-21 DIAGNOSIS — D271 Benign neoplasm of left ovary: Secondary | ICD-10-CM

## 2017-11-21 NOTE — Patient Instructions (Signed)
Please notify Dr Denman George at phone number [phone] if you notice vaginal bleeding that is heavy.  Return to see Dr Stann Mainland or your primary care doctor for annual wellness checks.  You can return to work on 11/29/17.

## 2017-11-21 NOTE — Progress Notes (Signed)
[place] at [facility]   Follow up Note: New Patient   Consult was initially requested by Dr. Vania Rea for a pelvic mass   Chief Complaint  Patient presents with  . Pelvic mass   ASSESSMENT Left ovarian fibroma with meigs syndrome.  S/p bilateral oophorectomy with hysterectomy (robotic with minilap).  PLAN  Follow-up with Dr Stann Mainland or PCP in 12 months for annual wellness visit.  No history of abn paps (with regular follow-up) therefore paps no longer necessary. Discussed HRT. Patient has no menopausal symptoms, therefore not indicated at this time.    HPI: [name]  is a very nice 47 y.o.  P1  She sees Dr. Stann Mainland for Gynecologic care. She was told she had fibroids in March 2019 and compared to a prior ultrasound there was no change. In July 2019 she noted constipation and increasing abdominal girth. She scheduled followup with Dr. Stann Mainland and repeat ultrasound was done. In addition thickening of the endometrium prompted an endometrial biopsy. We do not have the ultrasound reports prior 09/2017. EMB showed scant benign endometrial fragments. The office note discussion is that the ultrasound showed marked changes compared to prior including free fluid and so a CT scan was ordered.   Imaging with CTAP was done 10/22/17 Mild hepatomegaly at 17.7 cm craniocaudal. Scattered colonic diverticula. Aortic and branch vessel atherosclerosis. This is age advanced. No abdominopelvic adenopathy. Reproductive: A heterogeneous partially cystic and partially soft tissue density anterior pelvic mass measures on the order of 13.7 x 11.5 by 13.8 cm. This is intimately associated with and most likely arises from the left ovary, including on 72/2. Normal appearance of the uterus. Other: Areas of omental edema, including within the upper abdomen on image 22/2. No well-defined measurable peritoneal or omental mass. Moderate volume abdominopelvic ascites. Given the  clinical history, an exophytic uterine fibroid is possible but felt less likely.   Tumor markers are not yet performed.  The patient notes a prior history of heavy bleeding and being placed on Provera with no menses now for 19 years.  She denies N/V. Has had a weight gain rather than weight loss. Does note constipation and intermittent diarrhea. For the past 3 weeks she has noted early satiety.   Given the complex nature of the mass and its suspected ovarian origin she was referred for recommendations and management.  Interval Hx/ On 10/30/17 the patient underwent a robotic assisted total hysterectomy, BSO. Intraoperative findings were significant for a 16cm solid left ovarian mass that was a benign fibroma on frozen section. She required minilap for specimen delivery. Postop she has done well with no issues.    Imported EPIC Oncologic History:   No history exists.    ECOG PERFORMANCE STATUS: 1 - Symptomatic but completely ambulatory  Measurement of disease: TBD .   Radiology: No results found. .   Outpatient Encounter Medications as of 11/21/2017  Medication Sig  . ibuprofen (ADVIL,MOTRIN) 200 MG tablet Take 600 mg by mouth every 6 (six) hours as needed for headache or moderate pain.   Marland Kitchen lisinopril-hydrochlorothiazide (PRINZIDE,ZESTORETIC) 20-12.5 MG tablet Take 1 tablet by mouth every morning.   . [DISCONTINUED] oxyCODONE (OXY IR/ROXICODONE) 5 MG immediate release tablet Take 1 tablet (5 mg total) by mouth every 6 (six) hours as needed for severe pain. (Patient not taking: Reported on 11/21/2017)  . [DISCONTINUED] Polyethylene Glycol 3350 (MIRALAX PO) Take by mouth daily as needed.  . [DISCONTINUED] Sennosides (EX-LAX PO) Take by  mouth as needed.   No facility-administered encounter medications on file as of 11/21/2017.    Allergies  Allergen Reactions  . Codeine Other (See Comments)    Unknown reaction, mother was allergic    Past Medical History:  Diagnosis Date  .  Constipation   . History of DVT of lower extremity 1998   left lower leg , had leg injury before DVT ,  and phlebitis (was on BCP and smoker)--- COMPLETED COUMADIN  . Hypertension   . Uterine leiomyoma   . Wears dentures    UPPER  . Wears glasses    Past Surgical History:  Procedure Laterality Date  . CESAREAN SECTION W/BTL  02-06-1999    @[facility]   . DILATION AND CURETTAGE OF UTERUS  05-02-2005   DR WEIN   @[facility]   . ROBOTIC ASSISTED TOTAL HYSTERECTOMY WITH BILATERAL SALPINGO OOPHERECTOMY N/A 10/30/2017   Procedure: XI ROBOTIC ASSISTED TOTAL HYSTERECTOMY WITH BILATERAL SALPINGO OOPHORECTOMY MINI LAPAROTOMY;  Surgeon: Everitt Amber, MD;  Location: [facility];  Service: Gynecology;  Laterality: N/A;  . TONSILLECTOMY  07-02-1999   dr Constance Holster   AND SEPTOPLASTY        Past Gynecological History:   GYNECOLOGIC HISTORY:  . No LMP recorded. (Menstrual status: Other). 19 years ago . Menarche: 47 years old . P 1 . Contraceptive OCP > 10years . HRT none  . Last Pap 07/2015 negative on record Family Hx:  Family History  Problem Relation Age of Onset  . Diabetes Mother   . Hypertension Mother   . Prostate cancer Father 5   Social Hx:  Marland Kitchen Tobacco use: Current every day . Alcohol use: holidays only . Illicit Drug use: none . Illicit IV Drug use: none    Review of Systems: Review of Systems  Constitutional: Negative.   HENT:  Negative.   Eyes: Negative.   Respiratory: Negative.   Cardiovascular: Negative.   Gastrointestinal: Negative.  Negative for abdominal distention, constipation and diarrhea.  Endocrine: Negative.   Genitourinary: Negative.    Psychiatric/Behavioral: The patient is not nervous/anxious.   All other systems reviewed and are negative. + early satiety  Vitals: There were no vitals taken for this visit. Vitals:   11/21/17 0902  Weight: 236 lb 1.6 oz (107.1 kg)  Height: 5\' 7"  (1.702 m)    Vitals:   11/21/17 0902  BP: (!) 138/91  Pulse: 66  Resp: 20  Temp: 98.3 F (36.8  C)  SpO2: 99%   Body mass index is 36.98 kg/m.   Physical Exam: Well healed abdominal incisions. Vaginal cuff well healed with scant old blood.    [name], MD  11/21/2017, 9:29 AM

## 2019-12-05 IMAGING — CT CT ABD-PELV W/ CM
1 of 3 series · 12 of 32 positions shown, 18 images · IV contrast (APPLIED)
Comparison: None.

CLINICAL DATA: Abdominal pressure in swelling. Weight gain of 10
pounds in 1 month. Known fibroid.

EXAM:
CT ABDOMEN AND PELVIS WITH CONTRAST
TECHNIQUE: Multidetector CT imaging of the abdomen and pelvis was performed
using the standard protocol following bolus administration of
intravenous contrast.
CONTRAST:  100mL [id number] IOPAMIDOL ([id number]) INJECTION 61%

[Series 2: abd/pelvis w/cm · axial · 0.83mm/px · z∈[-441,-11]mm · 12 of 102 slices shown, 18 images]
[im 8/102  soft-tissue]
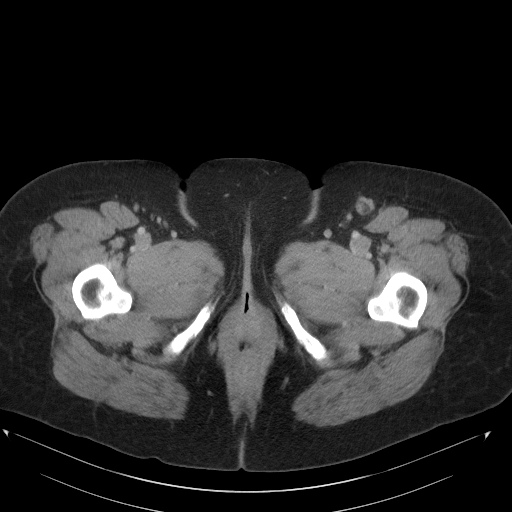
[im 8/102  bone]
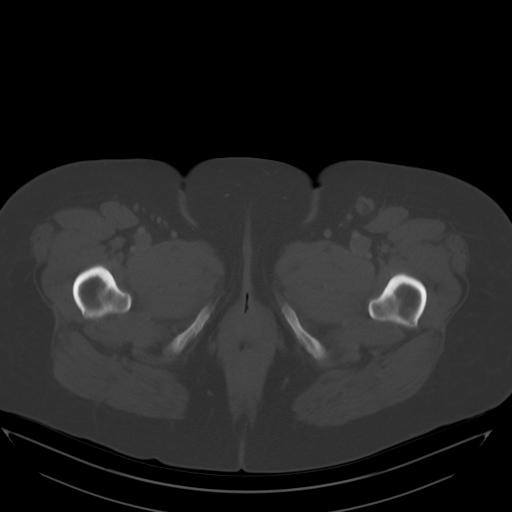
[im 16/102  soft-tissue]
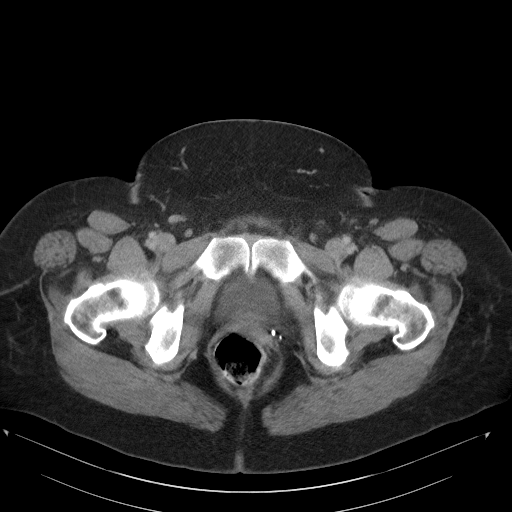
[im 24/102  soft-tissue]
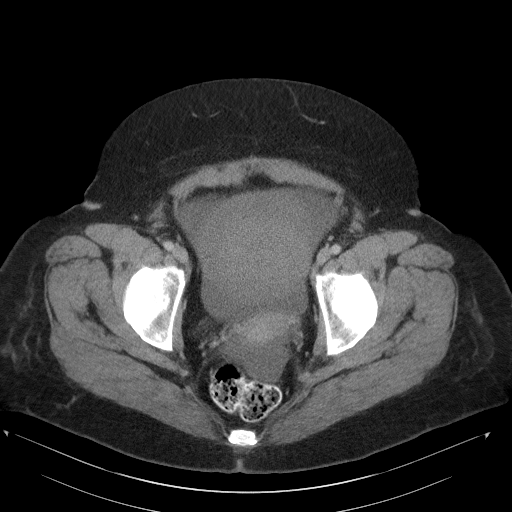
[im 32/102  soft-tissue]
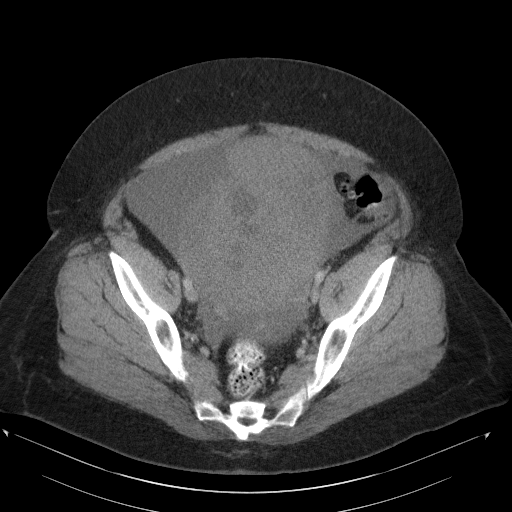
[im 39/102  soft-tissue]
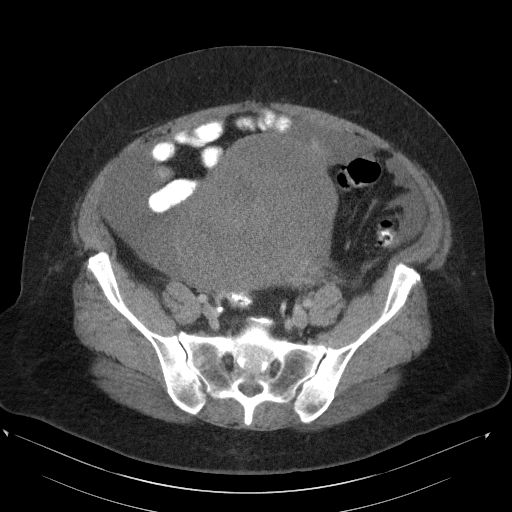
[im 47/102  soft-tissue]
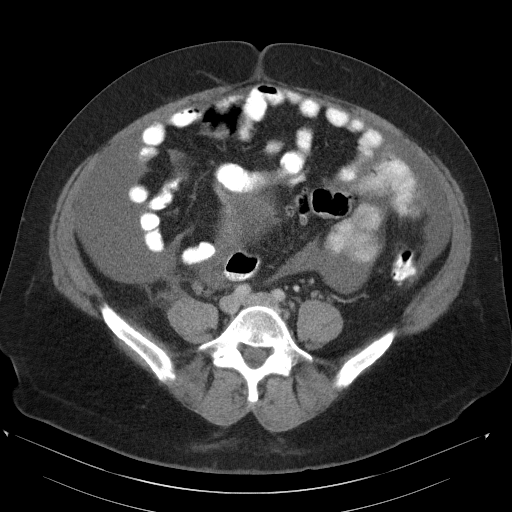
[im 55/102  soft-tissue]
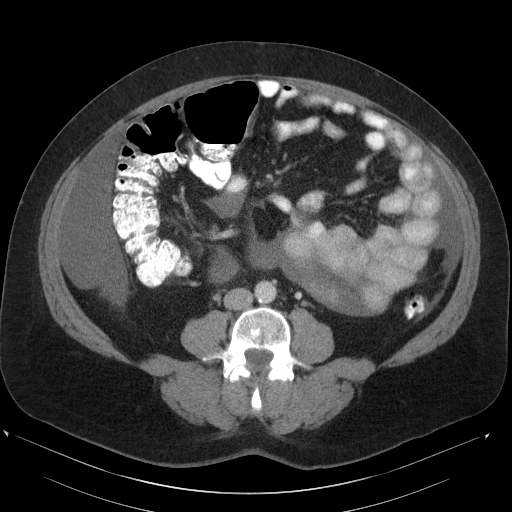
[im 63/102  soft-tissue]
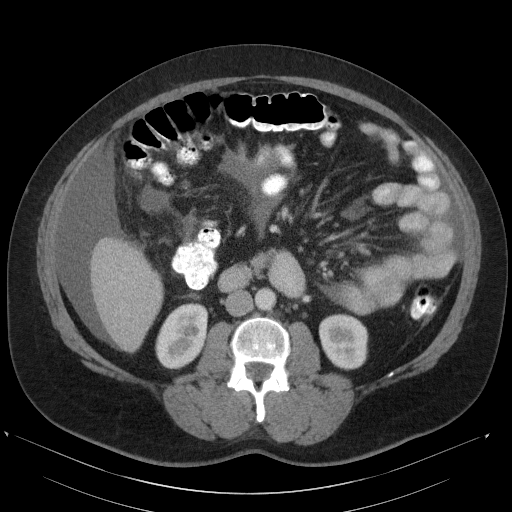
[im 70/102  soft-tissue]
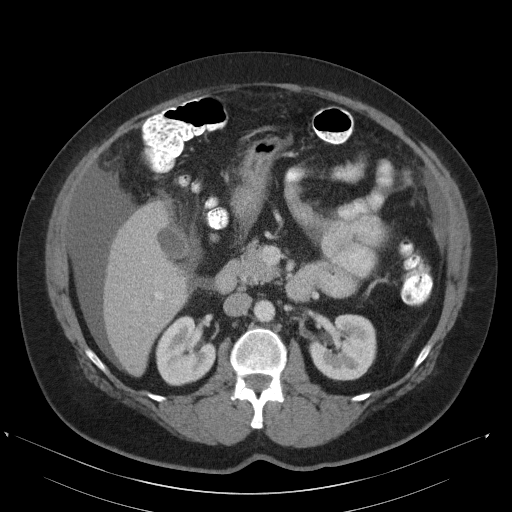
[im 70/102  lung]
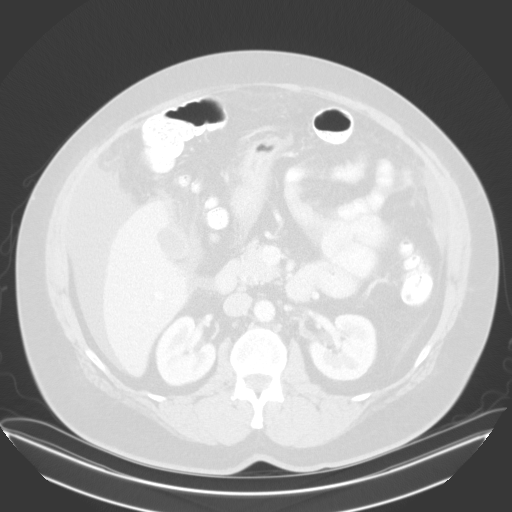
[im 70/102  bone]
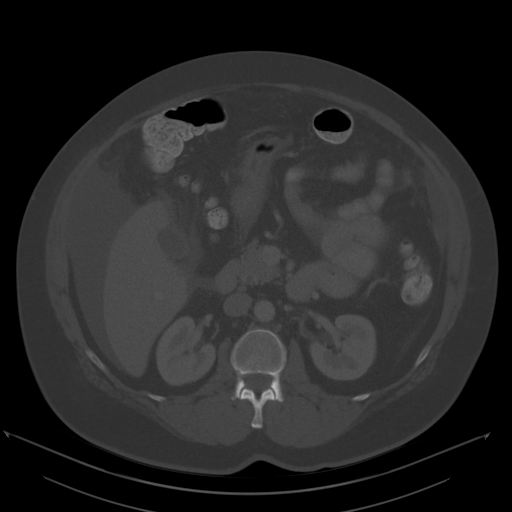
[im 78/102  soft-tissue]
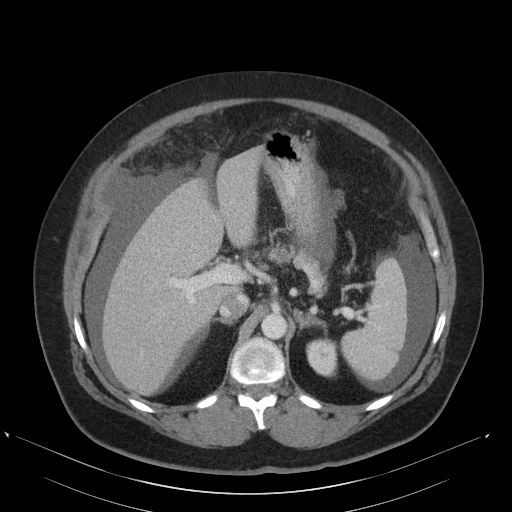
[im 78/102  lung]
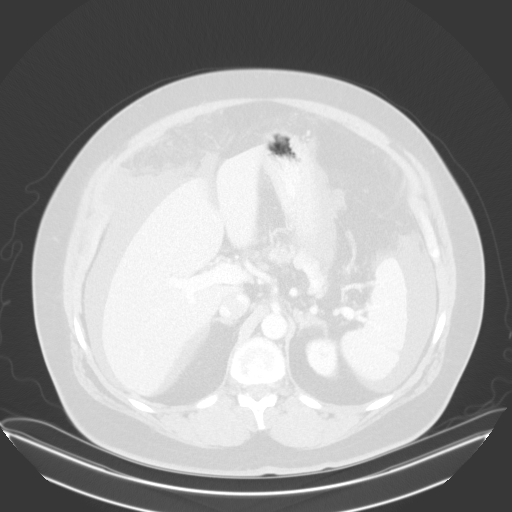
[im 86/102  soft-tissue]
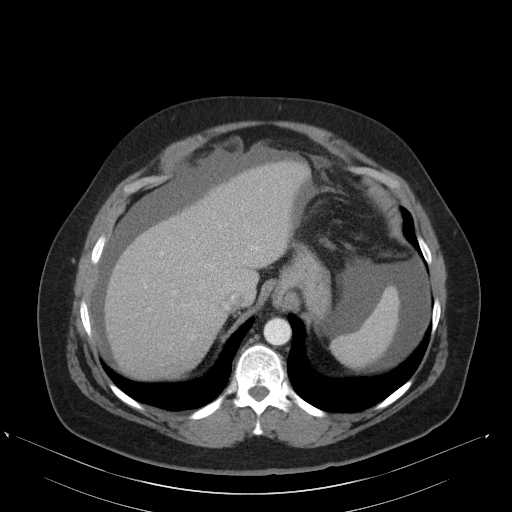
[im 86/102  lung]
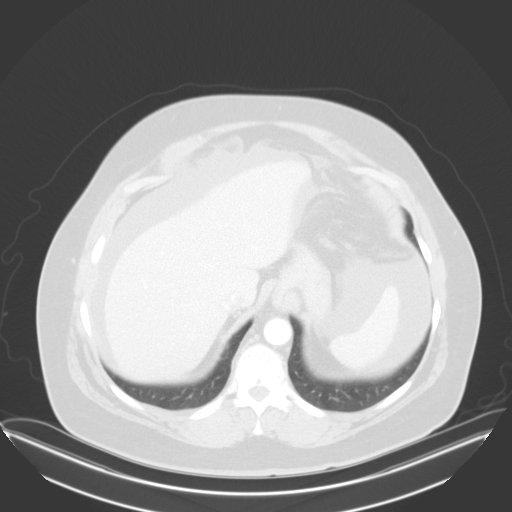
[im 94/102  soft-tissue]
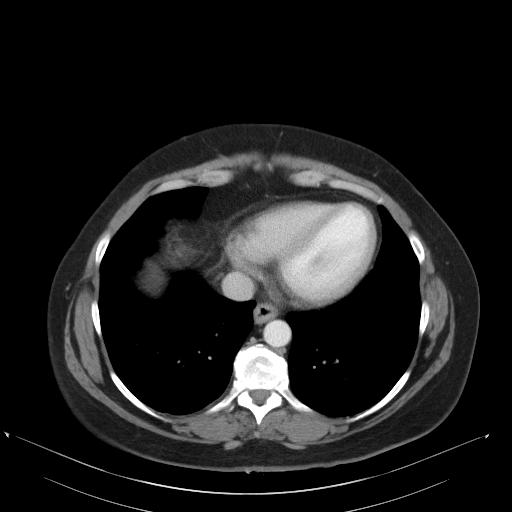
[im 94/102  lung]
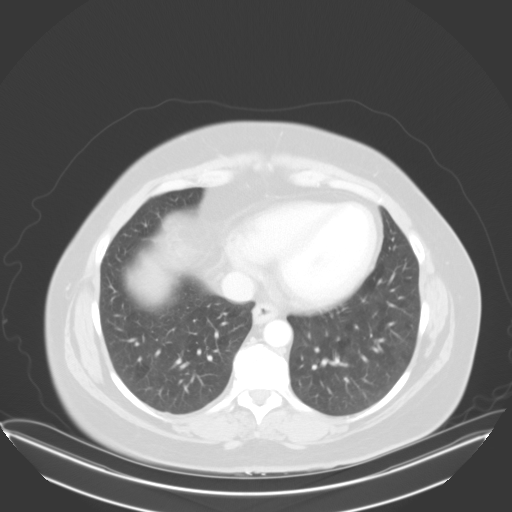

[12 of 32 positions shown; findings below may reference images not displayed]

FINDINGS: Lower chest: Clear lung bases. Normal heart size without pericardial
or pleural effusion.

Hepatobiliary: Mild hepatomegaly at 17.7 cm craniocaudal. No
cirrhosis. No focal liver lesion. Normal gallbladder, without
biliary ductal dilatation.

Pancreas: Normal, without mass or ductal dilatation.

Spleen: Normal in size, without focal abnormality.

Adrenals/Urinary Tract: Normal right adrenal gland. Mild left
adrenal thickening. No hydronephrosis. Normal urinary bladder.

Stomach/Bowel: Normal remainder of the stomach. Scattered colonic
diverticula. Normal colon and terminal ileum. Normal small bowel.

Vascular/Lymphatic: Aortic and branch vessel atherosclerosis. This
is age advanced. No abdominopelvic adenopathy.

Reproductive: A heterogeneous partially cystic and partially soft
tissue density anterior pelvic mass measures on the order of 13.7 x
11.5 by 13.8 cm. This is intimately associated with and most likely
arises from the left ovary, including on 72/2.

Normal appearance of the uterus.

Other: Small amount of abdominopelvic ascites. Interloop mesenteric
fluid. Areas of omental edema, including within the upper abdomen on
image [DATE]. No well-defined measurable peritoneal or omental mass.

Musculoskeletal: No acute osseous abnormality. Disc bulges at L4-5
and L5-S1.
IMPRESSION: 1. Moderate volume abdominopelvic ascites. Dominant anterior pelvic
mass. This is favored to arise from the left ovary and represent
ovarian neoplasm. Given the clinical history, an exophytic uterine
fibroid is possible but felt less likely. Consider gynecological
referral and if further imaging characterization is desired, pre and
post-contrast pelvic MRI.
2. Omental edema, which may be secondary to ascites. No well-defined
omental/peritoneal metastasis.
3.  Tiny hiatal hernia.
4. Aortic Atherosclerosis ([id number]). This is significantly age
advanced.
5. Mild hepatomegaly.

These results will be called to the ordering clinician or
representative by the Radiologist Assistant, and communication
documented in the PACS or zVision Dashboard.
